# Patient Record
Sex: Male | Born: 1970 | Race: Black or African American | Hispanic: No | Marital: Single | Smoking: Current every day smoker
Health system: Southern US, Community
[De-identification: ages and names within clinical notes are randomized; demographics above are authoritative.]

## PROBLEM LIST (undated history)

## (undated) DIAGNOSIS — R040 Epistaxis: Secondary | ICD-10-CM

## (undated) DIAGNOSIS — J45909 Unspecified asthma, uncomplicated: Secondary | ICD-10-CM

## (undated) HISTORY — PX: HERNIA REPAIR: SHX51

---

## 2004-08-16 ENCOUNTER — Emergency Department: Payer: Self-pay | Admitting: Emergency Medicine

## 2004-08-28 ENCOUNTER — Emergency Department: Payer: Self-pay | Admitting: Emergency Medicine

## 2005-03-01 ENCOUNTER — Emergency Department: Payer: Self-pay | Admitting: Emergency Medicine

## 2007-02-13 ENCOUNTER — Emergency Department: Payer: Self-pay | Admitting: Unknown Physician Specialty

## 2007-11-09 ENCOUNTER — Emergency Department: Payer: Self-pay | Admitting: Emergency Medicine

## 2009-12-29 ENCOUNTER — Emergency Department: Payer: Self-pay | Admitting: Emergency Medicine

## 2010-06-12 ENCOUNTER — Emergency Department: Payer: Self-pay | Admitting: Emergency Medicine

## 2010-08-10 ENCOUNTER — Emergency Department: Payer: Self-pay | Admitting: Emergency Medicine

## 2012-07-08 ENCOUNTER — Emergency Department: Payer: Self-pay | Admitting: Emergency Medicine

## 2012-07-08 LAB — CBC
HCT: 44.8 % (ref 40.0–52.0)
HGB: 15.5 g/dL (ref 13.0–18.0)
MCH: 31.4 pg (ref 26.0–34.0)
RBC: 4.92 10*6/uL (ref 4.40–5.90)

## 2012-07-08 LAB — BASIC METABOLIC PANEL
Calcium, Total: 9.2 mg/dL (ref 8.5–10.1)
Chloride: 100 mmol/L (ref 98–107)
Co2: 26 mmol/L (ref 21–32)
Creatinine: 1.04 mg/dL (ref 0.60–1.30)
EGFR (African American): >60
Glucose: 96 mg/dL (ref 65–99)
Sodium: 137 mmol/L (ref 136–145)

## 2012-07-29 ENCOUNTER — Emergency Department: Payer: Self-pay | Admitting: Emergency Medicine

## 2012-07-29 LAB — CBC
HCT: 48.1 % (ref 40.0–52.0)
HGB: 16.8 g/dL (ref 13.0–18.0)
MCV: 91 fL (ref 80–100)
Platelet: 227 10*3/uL (ref 150–440)
RBC: 5.28 10*6/uL (ref 4.40–5.90)
WBC: 7.4 10*3/uL (ref 3.8–10.6)

## 2012-07-29 LAB — COMPREHENSIVE METABOLIC PANEL
Albumin: 4.4 g/dL (ref 3.4–5.0)
Anion Gap: 6 — ABNORMAL LOW (ref 7–16)
BUN: 11 mg/dL (ref 7–18)
Calcium, Total: 9.4 mg/dL (ref 8.5–10.1)
Co2: 29 mmol/L (ref 21–32)
Osmolality: 277 (ref 275–301)
Potassium: 4 mmol/L (ref 3.5–5.1)
Sodium: 139 mmol/L (ref 136–145)
Total Protein: 8.9 g/dL — ABNORMAL HIGH (ref 6.4–8.2)

## 2012-07-29 LAB — DRUG SCREEN, URINE
Benzodiazepine, Ur Scrn: NEGATIVE (ref ?–200)
Cannabinoid 50 Ng, Ur ~~LOC~~: NEGATIVE (ref ?–50)
Cocaine Metabolite,Ur ~~LOC~~: NEGATIVE (ref ?–300)
MDMA (Ecstasy)Ur Screen: NEGATIVE (ref ?–500)
Opiate, Ur Screen: NEGATIVE (ref ?–300)
Phencyclidine (PCP) Ur S: NEGATIVE (ref ?–25)

## 2012-07-29 LAB — URINALYSIS, COMPLETE
Bilirubin,UR: NEGATIVE
Ketone: NEGATIVE
Ph: 5 (ref 4.5–8.0)
Protein: 30
Squamous Epithelial: <1

## 2012-07-29 LAB — ETHANOL: Ethanol %: <0.003 % (ref 0.000–0.080)

## 2013-10-19 DIAGNOSIS — K439 Ventral hernia without obstruction or gangrene: Secondary | ICD-10-CM | POA: Insufficient documentation

## 2014-03-28 ENCOUNTER — Emergency Department: Payer: Self-pay | Admitting: Emergency Medicine

## 2014-06-24 ENCOUNTER — Emergency Department: Payer: Self-pay | Admitting: Emergency Medicine

## 2014-08-08 ENCOUNTER — Emergency Department: Payer: Self-pay | Admitting: Emergency Medicine

## 2014-08-08 LAB — BASIC METABOLIC PANEL
ANION GAP: 5 — AB (ref 7–16)
BUN: 15 mg/dL (ref 7–18)
CO2: 27 mmol/L (ref 21–32)
Calcium, Total: 8.9 mg/dL (ref 8.5–10.1)
Chloride: 107 mmol/L (ref 98–107)
Creatinine: 1.24 mg/dL (ref 0.60–1.30)
EGFR (Non-African Amer.): >60
Glucose: 96 mg/dL (ref 65–99)
Osmolality: 278 (ref 275–301)
POTASSIUM: 4.4 mmol/L (ref 3.5–5.1)
Sodium: 139 mmol/L (ref 136–145)

## 2014-08-08 LAB — CBC
HCT: 48.2 % (ref 40.0–52.0)
HGB: 16.4 g/dL (ref 13.0–18.0)
MCH: 31.7 pg (ref 26.0–34.0)
MCHC: 34 g/dL (ref 32.0–36.0)
MCV: 93 fL (ref 80–100)
PLATELETS: 241 10*3/uL (ref 150–440)
RBC: 5.17 10*6/uL (ref 4.40–5.90)
RDW: 13.2 % (ref 11.5–14.5)
WBC: 7.2 10*3/uL (ref 3.8–10.6)
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
## 2015-04-27 ENCOUNTER — Encounter: Payer: Self-pay | Admitting: Emergency Medicine

## 2015-04-27 ENCOUNTER — Emergency Department
Admission: EM | Admit: 2015-04-27 | Discharge: 2015-04-27 | Disposition: A | Discharge status: Home / Self Care | Payer: Self-pay | Attending: Emergency Medicine | Admitting: Emergency Medicine

## 2015-04-27 ENCOUNTER — Emergency Department: Payer: Self-pay

## 2015-04-27 DIAGNOSIS — Y9289 Other specified places as the place of occurrence of the external cause: Secondary | ICD-10-CM | POA: Insufficient documentation

## 2015-04-27 DIAGNOSIS — X58XXXA Exposure to other specified factors, initial encounter: Secondary | ICD-10-CM | POA: Insufficient documentation

## 2015-04-27 DIAGNOSIS — Y9389 Activity, other specified: Secondary | ICD-10-CM | POA: Insufficient documentation

## 2015-04-27 DIAGNOSIS — Y998 Other external cause status: Secondary | ICD-10-CM | POA: Insufficient documentation

## 2015-04-27 DIAGNOSIS — S39011A Strain of muscle, fascia and tendon of abdomen, initial encounter: Secondary | ICD-10-CM | POA: Insufficient documentation

## 2015-04-27 DIAGNOSIS — R0981 Nasal congestion: Secondary | ICD-10-CM | POA: Insufficient documentation

## 2015-04-27 LAB — CBC WITH DIFFERENTIAL/PLATELET
BASOS ABS: 0.1 10*3/uL (ref 0–0.1)
BASOS PCT: 1 %
EOS ABS: 0.2 10*3/uL (ref 0–0.7)
Eosinophils Relative: 2 %
HEMATOCRIT: 49.7 % (ref 40.0–52.0)
Hemoglobin: 17 g/dL (ref 13.0–18.0)
Lymphocytes Relative: 35 %
Lymphs Abs: 2.7 10*3/uL (ref 1.0–3.6)
MCH: 31.8 pg (ref 26.0–34.0)
MCHC: 34.2 g/dL (ref 32.0–36.0)
MCV: 92.8 fL (ref 80.0–100.0)
Monocytes Absolute: 0.9 10*3/uL (ref 0.2–1.0)
Monocytes Relative: 12 %
Neutro Abs: 3.9 10*3/uL (ref 1.4–6.5)
Neutrophils Relative %: 50 %
PLATELETS: 230 10*3/uL (ref 150–440)
RBC: 5.35 MIL/uL (ref 4.40–5.90)
RDW: 14.1 % (ref 11.5–14.5)
WBC: 7.6 10*3/uL (ref 3.8–10.6)

## 2015-04-27 LAB — URINALYSIS COMPLETE WITH MICROSCOPIC (ARMC ONLY)
Bacteria, UA: NONE SEEN
Bilirubin Urine: NEGATIVE
Glucose, UA: NEGATIVE mg/dL
KETONES UR: NEGATIVE mg/dL
Leukocytes, UA: NEGATIVE
Nitrite: NEGATIVE
PROTEIN: 30 mg/dL — AB
SPECIFIC GRAVITY, URINE: 1.021 (ref 1.005–1.030)
Squamous Epithelial / LPF: NONE SEEN
pH: 5 (ref 5.0–8.0)

## 2015-04-27 LAB — COMPREHENSIVE METABOLIC PANEL
ALT: 35 U/L (ref 17–63)
ANION GAP: 7 (ref 5–15)
AST: 32 U/L (ref 15–41)
Albumin: 4.7 g/dL (ref 3.5–5.0)
Alkaline Phosphatase: 95 U/L (ref 38–126)
BUN: 16 mg/dL (ref 6–20)
CHLORIDE: 102 mmol/L (ref 101–111)
CO2: 28 mmol/L (ref 22–32)
CREATININE: 1.24 mg/dL (ref 0.61–1.24)
Calcium: 9.6 mg/dL (ref 8.9–10.3)
GFR calc Af Amer: >60 mL/min (ref >60)
Glucose, Bld: 106 mg/dL — ABNORMAL HIGH (ref 65–99)
Potassium: 3.9 mmol/L (ref 3.5–5.1)
Sodium: 137 mmol/L (ref 135–145)
TOTAL PROTEIN: 8.4 g/dL — AB (ref 6.5–8.1)
Total Bilirubin: 0.4 mg/dL (ref 0.3–1.2)

## 2015-04-27 LAB — LIPASE, BLOOD: Lipase: 28 U/L (ref 22–51)

## 2015-04-27 MED ORDER — CYCLOBENZAPRINE HCL 10 MG PO TABS
10.0000 mg | ORAL_TABLET | Freq: Once | ORAL | Status: AC
  Administered 2015-04-27: 10 mg via ORAL

## 2015-04-27 MED ORDER — ONDANSETRON 4 MG PO TBDP
4.0000 mg | ORAL_TABLET | Freq: Once | ORAL | Status: AC
  Administered 2015-04-27: 4 mg via ORAL

## 2015-04-27 MED ORDER — HYDROMORPHONE HCL 1 MG/ML IJ SOLN
INTRAMUSCULAR | Status: AC
  Administered 2015-04-27: 1 mg via INTRAMUSCULAR
  Filled 2015-04-27: qty 1

## 2015-04-27 MED ORDER — IBUPROFEN 600 MG PO TABS
ORAL_TABLET | ORAL | Status: AC
  Administered 2015-04-27: 600 mg via ORAL
  Filled 2015-04-27: qty 1

## 2015-04-27 MED ORDER — HYDROMORPHONE HCL 1 MG/ML IJ SOLN
1.0000 mg | Freq: Once | INTRAMUSCULAR | Status: AC
  Administered 2015-04-27: 1 mg via INTRAMUSCULAR

## 2015-04-27 MED ORDER — CYCLOBENZAPRINE HCL 10 MG PO TABS: 10.0000 mg | ORAL_TABLET | Freq: Three times a day (TID) | ORAL | Status: DC | PRN

## 2015-04-27 MED ORDER — CYCLOBENZAPRINE HCL 10 MG PO TABS
ORAL_TABLET | ORAL | Status: AC
  Administered 2015-04-27: 10 mg via ORAL
  Filled 2015-04-27: qty 1

## 2015-04-27 MED ORDER — ONDANSETRON 4 MG PO TBDP
ORAL_TABLET | ORAL | Status: AC
  Administered 2015-04-27: 4 mg via ORAL
  Filled 2015-04-27: qty 1

## 2015-04-27 MED ORDER — HYDROCODONE-ACETAMINOPHEN 5-325 MG PO TABS: 1.0000 | ORAL_TABLET | Freq: Four times a day (QID) | ORAL | Status: DC | PRN

## 2015-04-27 MED ORDER — IBUPROFEN 600 MG PO TABS: 600.0000 mg | ORAL_TABLET | Freq: Three times a day (TID) | ORAL | Status: DC | PRN

## 2015-04-27 MED ORDER — IBUPROFEN 600 MG PO TABS
600.0000 mg | ORAL_TABLET | Freq: Once | ORAL | Status: AC
  Administered 2015-04-27: 600 mg via ORAL

## 2015-04-27 NOTE — Discharge Instructions (Signed)
You examine evaluation are reassuring today. I suspect her pain is from a pulled muscle due to coughing and sneezing. We discussed no indication today for CT scan, however return to the emergency department immediately for any new or worsening abdominal pain, black or bloody stools, vomiting, fever, chest pain, trouble breathing, or any other symptoms concerning to you.   Muscle Strain A muscle strain (pulled muscle) happens when a muscle is stretched beyond normal length. It happens when a sudden, violent force stretches your muscle too far. Usually, a few of the fibers in your muscle are torn. Muscle strain is common in athletes. Recovery usually takes 1-2 weeks. Complete healing takes 5-6 weeks.  HOME CARE   Follow the PRICE method of treatment to help your injury get better. Do this the first 2-3 days after the injury:  Protect. Protect the muscle to keep it from getting injured again.  Rest. Limit your activity and rest the injured body part.  Ice. Put ice in a plastic bag. Place a towel between your skin and the bag. Then, apply the ice and leave it on from 15-20 minutes each hour. After the third day, switch to moist heat packs.  Compression. Use a splint or elastic bandage on the injured area for comfort. Do not put it on too tightly.  Elevate. Keep the injured body part above the level of your heart.  Only take medicine as told by your doctor.  Warm up before doing exercise to prevent future muscle strains. GET HELP IF:   You have more pain or puffiness (swelling) in the injured area.  You feel numbness, tingling, or notice a loss of strength in the injured area. MAKE SURE YOU:   Understand these instructions.  Will watch your condition.  Will get help right away if you are not doing well or get worse. Document Released: 08/06/2008 Document Revised: 08/18/2013 Document Reviewed: 05/27/2013 [facility] Patient Information 2015 [place], [place]. This information is not intended  to replace advice given to you by your health care provider. Make sure you discuss any questions you have with your health care provider.

## 2015-04-27 NOTE — ED Provider Notes (Signed)
[facility] Emergency Department Provider Note   ____________________________________________  Time seen: 6:45 PM I have reviewed the triage vital signs and the triage nursing note.  HISTORY  Chief Complaint Abdominal Pain   Historian Patient  HPI Mark [name] is a 44 y.o. male who is complaining of right side abdominal pain which is sharp, somewhat constant but waxing and waning. It is much worse when he sneezes. He's been sneezing and coughing recently due to a "head cold." He is not really complaining of shortness of breath, or chest pain. He's had no palpitations. He has no fever. He takes no medications daily. He has no primary care physician. Pain currently is 8 out of 10.   History reviewed. No pertinent past medical history. wheezing  There are no active problems to display for this patient.   History reviewed. No pertinent past surgical history.  Current Outpatient Rx  Name  Route  Sig  Dispense  Refill  . cyclobenzaprine (FLEXERIL) 10 MG tablet   Oral   Take 1 tablet (10 mg total) by mouth every 8 (eight) hours as needed for muscle spasms.   20 tablet   0   . HYDROcodone-acetaminophen (NORCO/VICODIN) 5-325 MG per tablet   Oral   Take 1 tablet by mouth every 6 (six) hours as needed for moderate pain.   10 tablet   0   . ibuprofen (ADVIL,MOTRIN) 600 MG tablet   Oral   Take 1 tablet (600 mg total) by mouth every 8 (eight) hours as needed.   20 tablet   0    "inhaler"  Allergies Peanuts  No family history on file.  Social History History  Substance Use Topics  . Smoking status: Not on file  . Smokeless tobacco: Not on file  . Alcohol Use: Not on file   positive for smoking  Review of Systems  Constitutional: Negative for fever. Eyes: Negative for visual changes. ENT: Negative for sore throat. Positive for sinus congestion. Cardiovascular: Negative for chest pain. Respiratory: Negative for shortness of breath. Positive  for cough and wheezing. Gastrointestinal: Negative for vomiting and diarrhea. Genitourinary: Negative for urinary symptoms Musculoskeletal: Negative for back pain. Skin: Negative for rash. Neurological: Negative for focal weakness or numbness.  ____________________________________________   PHYSICAL EXAM:  VITAL SIGNS: ED Triage Vitals  Enc Vitals Group     BP 04/27/15 1453 140/85 mmHg     Pulse Rate 04/27/15 1453 85     Resp 04/27/15 1453 20     Temp 04/27/15 1453 97.4 F (36.3 C)     Temp Source 04/27/15 1453 Oral     SpO2 04/27/15 1453 98 %     Weight 04/27/15 1453 185 lb (83.915 kg)     Height 04/27/15 1453  (1.753 m)     Head Cir --      Peak Flow --      Pain Score 04/27/15 1454 10     Pain Loc --      Pain Edu? --      Excl. in GC? --      Constitutional: Alert and oriented. Well appearing and in no distress. Eyes: Conjunctivae are normal. Left lazy eye deviation to the lateral side. ENT   Head: Normocephalic and atraumatic.   Nose: Positive for nasal congestion.   Mouth/Throat: Mucous membranes are moist.   Neck: No stridor. Cardiovascular: Normal rate, regular rhythm.  No murmurs, rubs, or gallops. Respiratory: Normal respiratory effort without tachypnea nor retractions. Mild  wheezing, right greater than left. No rhonchi. Gastrointestinal: Soft. No distention, no guarding, no rebound. Moderate point tenderness and right-sided abdomen below the area of the gallbladder.  Genitourinary/rectal: Deferred Musculoskeletal: Nontender with normal range of motion in all extremities. No joint effusions.  No lower extremity tenderness nor edema. Neurologic:  Normal speech and language. No gross focal neurologic deficits are appreciated. Skin:  Skin is warm, dry and intact. No rash noted. Psychiatric: Mood and affect are normal. Speech and behavior are normal. Patient exhibits appropriate insight and  judgment.  ____________________________________________   EKG I, Governor Rooks, MD, the attending physician have personally viewed and interpreted all ECGs.  None ____________________________________________  LABS (pertinent positives/negatives)  Lipase normal Metabolic panel normal Urinalysis negative for ketones, RBC, WBC, bacteria CBC normal  ____________________________________________  RADIOLOGY Imaging viewed by me, and interpreted by Radiologist   Chest x-ray two-view: No acute disease per my view, mild diffuse  pain suggesting acute bronchitis per radiologist   __________________________________________  PROCEDURES  Procedure(s) performed: None Critical Care performed: None  ____________________________________________   ED COURSE / ASSESSMENT AND PLAN  Pertinent labs & imaging results that were available during my care of the patient were reviewed by me and considered in my medical decision making (see chart for details).  Clinically I suspect a pulled abdominal muscle due to recent sneezing. His exam and evaluation are reassuring. Do not suspect biliary colic, ureteral colic, or other emergency thoracic, or abdominal source of his pain. Return precautions and discharge instructions and follow-up discussed with patient.  Patient has an albuterol inhaler per the patient, he's having no significant trouble breathing to baseline and his symptoms seem to be more head cold and sneezing rather than COPD or bronchitis.  ___________________________________________   FINAL CLINICAL IMPRESSION(S) / ED DIAGNOSES   Final diagnoses:  Abdominal muscle strain, initial encounter  Sinus congestion      Governor Rooks, MD 04/27/15 2041

## 2015-04-27 NOTE — ED Notes (Signed)
Patient with no complaints at this time. Respirations even and unlabored. Skin warm/dry. Discharge instructions reviewed with patient at this time. Patient given opportunity to voice concerns/ask questions. Patient discharged at this time and left Emergency Department with steady gait.   

## 2015-04-27 NOTE — ED Notes (Signed)
Patient presents to the ED with sharp right sided abdominal pain when he sneezes.  Patient states he only has the pain when he sneezes.  Patient reports cold symptoms for several days including, cough, congestion and sneezing.  Patient is in no obvious distress at this time.

## 2015-12-07 ENCOUNTER — Emergency Department: Payer: Self-pay

## 2015-12-07 ENCOUNTER — Emergency Department
Admission: EM | Admit: 2015-12-07 | Discharge: 2015-12-07 | Disposition: A | Discharge status: Home / Self Care | Payer: Self-pay | Attending: Emergency Medicine | Admitting: Emergency Medicine

## 2015-12-07 ENCOUNTER — Encounter: Payer: Self-pay | Admitting: Emergency Medicine

## 2015-12-07 DIAGNOSIS — F17210 Nicotine dependence, cigarettes, uncomplicated: Secondary | ICD-10-CM | POA: Insufficient documentation

## 2015-12-07 DIAGNOSIS — R5383 Other fatigue: Secondary | ICD-10-CM | POA: Insufficient documentation

## 2015-12-07 DIAGNOSIS — J45901 Unspecified asthma with (acute) exacerbation: Secondary | ICD-10-CM | POA: Insufficient documentation

## 2015-12-07 DIAGNOSIS — J209 Acute bronchitis, unspecified: Secondary | ICD-10-CM

## 2015-12-07 HISTORY — DX: Unspecified asthma, uncomplicated: J45.909

## 2015-12-07 MED ORDER — ALBUTEROL SULFATE HFA 108 (90 BASE) MCG/ACT IN AERS: 1.0000 | INHALATION_SPRAY | Freq: Four times a day (QID) | RESPIRATORY_TRACT | Status: AC | PRN

## 2015-12-07 MED ORDER — HYDROCOD POLST-CPM POLST ER 10-8 MG/5ML PO SUER: 5.0000 mL | Freq: Two times a day (BID) | ORAL | Status: DC | PRN

## 2015-12-07 MED ORDER — AZITHROMYCIN 250 MG PO TABS: ORAL_TABLET | ORAL | Status: DC

## 2015-12-07 NOTE — Discharge Instructions (Signed)
Acute Bronchitis Bronchitis is inflammation of the airways that extend from the windpipe into the lungs (bronchi). The inflammation often causes mucus to develop. This leads to a cough, which is the most common symptom of bronchitis.  In acute bronchitis, the condition usually develops suddenly and goes away over time, usually in a couple weeks. Smoking, allergies, and asthma can make bronchitis worse. Repeated episodes of bronchitis may cause further lung problems.  CAUSES Acute bronchitis is most often caused by the same virus that causes a cold. The virus can spread from person to person (contagious) through coughing, sneezing, and touching contaminated objects. SIGNS AND SYMPTOMS   Cough.   Fever.   Coughing up mucus.   Body aches.   Chest congestion.   Chills.   Shortness of breath.   Sore throat.  DIAGNOSIS  Acute bronchitis is usually diagnosed through a physical exam. Your health care provider will also ask you questions about your medical history. Tests, such as chest X-rays, are sometimes done to rule out other conditions.  TREATMENT  Acute bronchitis usually goes away in a couple weeks. Oftentimes, no medical treatment is necessary. Medicines are sometimes given for relief of fever or cough. Antibiotic medicines are usually not needed but may be prescribed in certain situations. In some cases, an inhaler may be recommended to help reduce shortness of breath and control the cough. A cool mist vaporizer may also be used to help thin bronchial secretions and make it easier to clear the chest.  HOME CARE INSTRUCTIONS  Get plenty of rest.   Drink enough fluids to keep your urine clear or pale yellow (unless you have a medical condition that requires fluid restriction). Increasing fluids may help thin your respiratory secretions (sputum) and reduce chest congestion, and it will prevent dehydration.   Take medicines only as directed by your health care provider.  If  you were prescribed an antibiotic medicine, finish it all even if you start to feel better.  Avoid smoking and secondhand smoke. Exposure to cigarette smoke or irritating chemicals will make bronchitis worse. If you are a smoker, consider using nicotine gum or skin patches to help control withdrawal symptoms. Quitting smoking will help your lungs heal faster.   Reduce the chances of another bout of acute bronchitis by washing your hands frequently, avoiding people with cold symptoms, and trying not to touch your hands to your mouth, nose, or eyes.   Keep all follow-up visits as directed by your health care provider.  SEEK MEDICAL CARE IF: Your symptoms do not improve after 1 week of treatment.  SEEK IMMEDIATE MEDICAL CARE IF:  You develop an increased fever or chills.   You have chest pain.   You have severe shortness of breath.  You have bloody sputum.   You develop dehydration.  You faint or repeatedly feel like you are going to pass out.  You develop repeated vomiting.  You develop a severe headache. MAKE SURE YOU:   Understand these instructions.  Will watch your condition.  Will get help right away if you are not doing well or get worse.   This information is not intended to replace advice given to you by your health care provider. Make sure you discuss any questions you have with your health care provider.   Document Released: 12/05/2004 Document Revised: 11/18/2014 Document Reviewed: 04/20/2013 Elsevier Interactive Patient Education [employer]   Please follow up with your PCP in 2 days if not improving.

## 2015-12-07 NOTE — ED Provider Notes (Signed)
[facility] Emergency Department Provider Note  ____________________________________________  Time seen: Approximately 10:03 AM  I have reviewed the triage vital signs and the nursing notes.   HISTORY  Chief Complaint No chief complaint on file.    HPI Mark [name] is a 45 y.o. male, NAD, presents to the emergency department this morning with 2 weeks of chest congestion and cough. Notes everyone in his household is ill with the same symptoms but they have improved. Denies nasal congestion, sinus pressure, ear pain, sore throat, postnasal drip, ear pain. Does note headache with cough. Cough can be cyclical and keeps him up at night. States he has an inhaler but cannot find it to use. Does note some wheezing when he lays down at night but no other time. Smokes one pack of tobacco cigarettes daily and has been doing so for over 20 years. Denies chest pain, shortness of breath, fever, chills, body aches.   Past Medical History  Diagnosis Date  . Asthma     There are no active problems to display for this patient.   History reviewed. No pertinent past surgical history.  Current Outpatient Rx  Name  Route  Sig  Dispense  Refill  . albuterol (PROVENTIL HFA;VENTOLIN HFA) 108 (90 Base) MCG/ACT inhaler   Inhalation   Inhale 1-2 puffs into the lungs every 6 (six) hours as needed for wheezing or shortness of breath.   1 Inhaler   2   . azithromycin (ZITHROMAX Z-PAK) 250 MG tablet      Take 2 tablets (500 mg) on  Day 1,  followed by 1 tablet (250 mg) once daily on Days 2 through 5.   6 each   0   . chlorpheniramine-HYDROcodone (TUSSIONEX PENNKINETIC ER) 10-8 MG/5ML SUER   Oral   Take 5 mLs by mouth every 12 (twelve) hours as needed for cough.   70 mL   0   . cyclobenzaprine (FLEXERIL) 10 MG tablet   Oral   Take 1 tablet (10 mg total) by mouth every 8 (eight) hours as needed for muscle spasms.   20 tablet   0   . HYDROcodone-acetaminophen  (NORCO/VICODIN) 5-325 MG per tablet   Oral   Take 1 tablet by mouth every 6 (six) hours as needed for moderate pain.   10 tablet   0   . ibuprofen (ADVIL,MOTRIN) 600 MG tablet   Oral   Take 1 tablet (600 mg total) by mouth every 8 (eight) hours as needed.   20 tablet   0     Allergies Peanuts  No family history on file.  Social History Social History  Substance Use Topics  . Smoking status: Current Every Day Smoker  . Smokeless tobacco: None  . Alcohol Use: No     Review of Systems  Constitutional: No fever/chills. Positive for fatigue. Eyes: No visual changes. No discharge ENT: No sore throat, ear pain, nasal congestion, sinus pressure, his nasal drip Cardiovascular: no chest pain. Respiratory: Positive for cough, congestion, wheezing. Negative for shortness of breath Musculoskeletal: Negative for myalgias Skin: Negative for rash. Neurological: Positive for headache with cough. No focal weakness, numbness, tingling 10-point ROS otherwise negative.  ____________________________________________   PHYSICAL EXAM:  VITAL SIGNS: ED Triage Vitals  Enc Vitals Group     BP 12/07/15 0928 121/77 mmHg     Pulse Rate 12/07/15 0928 85     Resp 12/07/15 0928 20     Temp 12/07/15 0928 98.2 F (36.8 C)  Temp Source 12/07/15 0928 Oral     SpO2 12/07/15 0928 95 %     Weight 12/07/15 0928 180 lb (81.647 kg)     Height 12/07/15 0928  (1.753 m)     Head Cir --      Peak Flow --      Pain Score --      Pain Loc --      Pain Edu? --      Excl. in GC? --     Constitutional: Alert and oriented. Well appearing and in no acute distress. Eyes: Conjunctivae are normal. Head: Atraumatic. ENT:      Ears: Bilateral TMs visualized with injection and mild bulging. No effusion. Lateral ear canals within normal limits.      Nose: No congestion/rhinnorhea.      Mouth/Throat: Mucous membranes are moist. No oral lesions. Neck: No stridor. Neck supple with full range of  motion Hematological/Lymphatic/Immunilogical: No cervical lymphadenopathy. Cardiovascular: Normal rate, regular rhythm. Normal S1 and S2.  Good peripheral circulation. Respiratory: Normal respiratory effort without tachypnea or retractions. Mild wheeze noted in right upper lung field otherwise all other lung fields CTA be Neurologic:  Normal speech and language. No gross focal neurologic deficits are appreciated.  Skin:  Skin is warm, dry and intact. No rash noted. Psychiatric: Mood and affect are normal. Speech and behavior are normal. Patient exhibits appropriate insight and judgement.   ____________________________________________   LABS (all labs ordered are listed, but only abnormal results are displayed)  Labs Reviewed - No data to display ____________________________________________  EKG  None ____________________________________________  RADIOLOGY I, Hagler,Jami L, personally viewed and evaluated these images (plain radiographs) as part of my medical decision making, as well as reviewing the written report by the radiologist.  Dg Chest 2 View  12/07/2015  CLINICAL DATA:  Cough for 2 weeks.  Nasal congestion. EXAM: CHEST  2 VIEW COMPARISON:  04/27/2015 FINDINGS: The heart size and mediastinal contours are within normal limits. Both lungs are clear. The visualized skeletal structures are unremarkable. IMPRESSION: No active cardiopulmonary disease. Electronically Signed   By: Elige Ko   On: 12/07/2015 10:30    ____________________________________________    PROCEDURES  Procedure(s) performed: None    Medications - No data to display   ____________________________________________   INITIAL IMPRESSION / ASSESSMENT AND PLAN / ED COURSE  Pertinent labs & imaging results that were available during my care of the patient were reviewed by me and considered in my medical decision making (see chart for details).  Patient's diagnosis is consistent with acute  bronchitis. Patient will be discharged home with prescriptions for azithromycin to treat bacterial infection, depression next cough syrup to treat cough and increase rest, albuterol inhaler to use as needed for shortness of breath. Patient is to follow up with his primary care provider if symptoms persist past this treatment course. Patient is given ED precautions to return to the ED for any worsening or new symptoms.     ____________________________________________  FINAL CLINICAL IMPRESSION(S) / ED DIAGNOSES  Final diagnoses:  Acute bronchitis, unspecified organism      NEW MEDICATIONS STARTED DURING THIS VISIT:  Discharge Medication List as of 12/07/2015 10:57 AM    START taking these medications   Details  albuterol (PROVENTIL HFA;VENTOLIN HFA) 108 (90 Base) MCG/ACT inhaler Inhale 1-2 puffs into the lungs every 6 (six) hours as needed for wheezing or shortness of breath., Starting 12/07/2015, Until Discontinued, Print    azithromycin (ZITHROMAX Z-PAK) 250 MG tablet  Take 2 tablets (500 mg) on  Day 1,  followed by 1 tablet (250 mg) once daily on Days 2 through 5., Print    chlorpheniramine-HYDROcodone (TUSSIONEX PENNKINETIC ER) 10-8 MG/5ML SUER Take 5 mLs by mouth every 12 (twelve) hours as needed for cough., Starting 12/07/2015, Until Discontinued, Print            Hope Pigeon, PA-C 12/07/15 1121  Emily Filbert, MD 12/07/15 [phone]

## 2015-12-07 NOTE — ED Notes (Signed)
Cold sx's for the past 2 weeks   Chest and nasal congestion

## 2015-12-13 ENCOUNTER — Encounter: Payer: Self-pay | Admitting: Urgent Care

## 2015-12-13 ENCOUNTER — Emergency Department
Admission: EM | Admit: 2015-12-13 | Discharge: 2015-12-13 | Disposition: A | Discharge status: Home / Self Care | Payer: Self-pay | Attending: Emergency Medicine | Admitting: Emergency Medicine

## 2015-12-13 DIAGNOSIS — R040 Epistaxis: Secondary | ICD-10-CM | POA: Insufficient documentation

## 2015-12-13 DIAGNOSIS — F17200 Nicotine dependence, unspecified, uncomplicated: Secondary | ICD-10-CM | POA: Insufficient documentation

## 2015-12-13 MED ORDER — OXYMETAZOLINE HCL 0.05 % NA SOLN: 2.0000 | Freq: Two times a day (BID) | NASAL | Status: DC | PRN

## 2015-12-13 NOTE — Discharge Instructions (Signed)

## 2015-12-13 NOTE — ED Provider Notes (Signed)
[facility] Emergency Department Provider Note  ____________________________________________  Time seen: Approximately 8:15 PM  I have reviewed the triage vital signs and the nursing notes.   HISTORY  Chief Complaint Epistaxis    HPI Mark [name] is a 45 y.o. male who presents to emergency department for 9 day history of intermittent nosebleed. Patient states that he has had a long history of nosebleed stretching back to "well secured." He states that he gets them every "so often." He states that this is different and so much as he has had a nosebleed daily 9 days. Patient states that he came in to stop with direct pressure. He denies any headaches associated with this. He denies any visual acuity changes, neck pain, chest pain. He denies being on blood thinners or blood pressure medication. Patient denies any trauma to area.   Past Medical History  Diagnosis Date  . Asthma     There are no active problems to display for this patient.   History reviewed. No pertinent past surgical history.  Current Outpatient Rx  Name  Route  Sig  Dispense  Refill  . albuterol (PROVENTIL HFA;VENTOLIN HFA) 108 (90 Base) MCG/ACT inhaler   Inhalation   Inhale 1-2 puffs into the lungs every 6 (six) hours as needed for wheezing or shortness of breath.   1 Inhaler   2   . azithromycin (ZITHROMAX Z-PAK) 250 MG tablet      Take 2 tablets (500 mg) on  Day 1,  followed by 1 tablet (250 mg) once daily on Days 2 through 5.   6 each   0   . chlorpheniramine-HYDROcodone (TUSSIONEX PENNKINETIC ER) 10-8 MG/5ML SUER   Oral   Take 5 mLs by mouth every 12 (twelve) hours as needed for cough.   70 mL   0   . cyclobenzaprine (FLEXERIL) 10 MG tablet   Oral   Take 1 tablet (10 mg total) by mouth every 8 (eight) hours as needed for muscle spasms.   20 tablet   0   . HYDROcodone-acetaminophen (NORCO/VICODIN) 5-325 MG per tablet   Oral   Take 1 tablet by mouth every 6 (six)  hours as needed for moderate pain.   10 tablet   0   . ibuprofen (ADVIL,MOTRIN) 600 MG tablet   Oral   Take 1 tablet (600 mg total) by mouth every 8 (eight) hours as needed.   20 tablet   0   . oxymetazoline (AFRIN) 0.05 % nasal spray   Each Nare   Place 2 sprays into both nostrils 2 (two) times daily as needed (epistaxis).   15 mL   2     Allergies Peanuts  No family history on file.  Social History Social History  Substance Use Topics  . Smoking status: Current Every Day Smoker  . Smokeless tobacco: None  . Alcohol Use: No     Review of Systems  Constitutional: No fever/chills Eyes: No visual changes. No discharge ENT: No sore throat. Positive for nosebleeds Cardiovascular: no chest pain. Respiratory: no cough. No SOB. Gastrointestinal: No abdominal pain.  No nausea, no vomiting.  Skin: Negative for rash. Neurological: Negative for headaches, focal weakness or numbness. 10-point ROS otherwise negative.  ____________________________________________   PHYSICAL EXAM:  VITAL SIGNS: ED Triage Vitals  Enc Vitals Group     BP 12/13/15 1936 140/88 mmHg     Pulse Rate 12/13/15 1936 82     Resp 12/13/15 1936 18  Temp 12/13/15 1936 98.6 F (37 C)     Temp Source 12/13/15 1936 Oral     SpO2 12/13/15 1936 96 %     Weight 12/13/15 1936 180 lb (81.647 kg)     Height 12/13/15 1936  (1.727 m)     Head Cir --      Peak Flow --      Pain Score 12/13/15 1937 0     Pain Loc --      Pain Edu? --      Excl. in GC? --      Constitutional: Alert and oriented. Well appearing and in no acute distress. Eyes: Conjunctivae are normal. PERRL. EOMI. Head: Atraumatic. ENT:      Ears:       Nose: No congestion/rhinnorhea. Scabbing is noted over the distal Kiesselbach plexus on the left nares. No other abnormality. Turbinates are nonerythematous and nonedematous. No bleeding at this time.      Mouth/Throat: Mucous membranes are moist.  Neck: No stridor.    Hematological/Lymphatic/Immunilogical: No cervical lymphadenopathy. Cardiovascular: Normal rate, regular rhythm. Normal S1 and S2.  Good peripheral circulation. Respiratory: Normal respiratory effort without tachypnea or retractions. Lungs CTAB. Neurologic:  Normal speech and language. No gross focal neurologic deficits are appreciated.  Skin:  Skin is warm, dry and intact. No rash noted. Psychiatric: Mood and affect are normal. Speech and behavior are normal. Patient exhibits appropriate insight and judgement.   ____________________________________________   LABS (all labs ordered are listed, but only abnormal results are displayed)  Labs Reviewed - No data to display ____________________________________________  EKG   ____________________________________________  RADIOLOGY   No results found.  ____________________________________________    PROCEDURES  Procedure(s) performed:       Medications - No data to display   ____________________________________________   INITIAL IMPRESSION / ASSESSMENT AND PLAN / ED COURSE  Pertinent labs & imaging results that were available during my care of the patient were reviewed by me and considered in my medical decision making (see chart for details).  Patient's diagnosis is consistent with epistaxis. There is no current bleeding at this time. Patient is informed that he needs to follow-up with an ears nose and throat provider for further evaluation and treatment.. Patient will be discharged home with prescriptions for Afrin to be used as needed for recurring nosebleeds.  Patient is given ED precautions to return to the ED for any worsening or new symptoms.     ____________________________________________  FINAL CLINICAL IMPRESSION(S) / ED DIAGNOSES  Final diagnoses:  Epistaxis, recurrent      NEW MEDICATIONS STARTED DURING THIS VISIT:  New Prescriptions   OXYMETAZOLINE (AFRIN) 0.05 % NASAL SPRAY    Place 2  sprays into both nostrils 2 (two) times daily as needed (epistaxis).        Delorise Royals Cuthriell, PA-C 12/13/15 1610  Phineas Semen, MD 12/13/15 2153

## 2015-12-13 NOTE — ED Notes (Signed)
Patient presents with c/o a daily nose bleed s/p having a cold recently. Reports daily bleeding from LEFT nare x 9 days. No active bleeding at this time. Of note, patient reports that family member here for treatment (arrived via EMS) - patient states, "I thought I would just get checked out while I was up here anyway".

## 2015-12-13 NOTE — ED Notes (Signed)
Pt states bleeding 1-2 times a day for 9 days from left nostril.  States "covers half of a washcloth".  Denies injury to nose, inserting anything into nose, denies taking blood thinners.

## 2016-06-10 ENCOUNTER — Emergency Department
Admission: EM | Admit: 2016-06-10 | Discharge: 2016-06-10 | Disposition: A | Discharge status: Home / Self Care | Payer: Self-pay | Attending: Emergency Medicine | Admitting: Emergency Medicine

## 2016-06-10 ENCOUNTER — Emergency Department: Payer: Self-pay

## 2016-06-10 ENCOUNTER — Encounter: Payer: Self-pay | Admitting: Emergency Medicine

## 2016-06-10 DIAGNOSIS — Z7951 Long term (current) use of inhaled steroids: Secondary | ICD-10-CM | POA: Insufficient documentation

## 2016-06-10 DIAGNOSIS — J45909 Unspecified asthma, uncomplicated: Secondary | ICD-10-CM | POA: Insufficient documentation

## 2016-06-10 DIAGNOSIS — R519 Headache, unspecified: Secondary | ICD-10-CM

## 2016-06-10 DIAGNOSIS — R079 Chest pain, unspecified: Secondary | ICD-10-CM | POA: Insufficient documentation

## 2016-06-10 DIAGNOSIS — F17200 Nicotine dependence, unspecified, uncomplicated: Secondary | ICD-10-CM | POA: Insufficient documentation

## 2016-06-10 DIAGNOSIS — R51 Headache: Secondary | ICD-10-CM | POA: Insufficient documentation

## 2016-06-10 LAB — COMPREHENSIVE METABOLIC PANEL
ALBUMIN: 4.6 g/dL (ref 3.5–5.0)
ALT: 28 U/L (ref 17–63)
ANION GAP: 8 (ref 5–15)
AST: 27 U/L (ref 15–41)
Alkaline Phosphatase: 76 U/L (ref 38–126)
BUN: 11 mg/dL (ref 6–20)
CO2: 25 mmol/L (ref 22–32)
Calcium: 9.4 mg/dL (ref 8.9–10.3)
Chloride: 101 mmol/L (ref 101–111)
Creatinine, Ser: 1.01 mg/dL (ref 0.61–1.24)
GFR calc Af Amer: >60 mL/min (ref >60)
GFR calc non Af Amer: >60 mL/min (ref >60)
GLUCOSE: 109 mg/dL — AB (ref 65–99)
POTASSIUM: 4.6 mmol/L (ref 3.5–5.1)
Sodium: 134 mmol/L — ABNORMAL LOW (ref 135–145)
Total Bilirubin: 0.8 mg/dL (ref 0.3–1.2)
Total Protein: 8 g/dL (ref 6.5–8.1)

## 2016-06-10 LAB — CBC WITH DIFFERENTIAL/PLATELET
BASOS ABS: 0.1 10*3/uL (ref 0–0.1)
Eosinophils Absolute: 0 10*3/uL (ref 0–0.7)
Eosinophils Relative: 0 %
HCT: 44.9 % (ref 40.0–52.0)
HEMOGLOBIN: 16.2 g/dL (ref 13.0–18.0)
Lymphocytes Relative: 24 %
Lymphs Abs: 2.4 10*3/uL (ref 1.0–3.6)
MCH: 32 pg (ref 26.0–34.0)
MCHC: 36.1 g/dL — ABNORMAL HIGH (ref 32.0–36.0)
MCV: 88.7 fL (ref 80.0–100.0)
Monocytes Absolute: 0.6 10*3/uL (ref 0.2–1.0)
Monocytes Relative: 6 %
NEUTROS ABS: 6.8 10*3/uL — AB (ref 1.4–6.5)
Platelets: 217 10*3/uL (ref 150–440)
RBC: 5.06 MIL/uL (ref 4.40–5.90)
RDW: 14 % (ref 11.5–14.5)
WBC: 9.9 10*3/uL (ref 3.8–10.6)

## 2016-06-10 LAB — TROPONIN I: Troponin I: <0.03 ng/mL (ref <0.03)

## 2016-06-10 MED ORDER — SODIUM CHLORIDE 0.9 % IV BOLUS (SEPSIS)
1000.0000 mL | Freq: Once | INTRAVENOUS | Status: AC
  Administered 2016-06-10: 1000 mL via INTRAVENOUS

## 2016-06-10 MED ORDER — PROCHLORPERAZINE MALEATE 10 MG PO TABS: 10.0000 mg | ORAL_TABLET | Freq: Three times a day (TID) | ORAL | 0 refills | Status: DC | PRN

## 2016-06-10 MED ORDER — PROCHLORPERAZINE EDISYLATE 5 MG/ML IJ SOLN
10.0000 mg | Freq: Once | INTRAMUSCULAR | Status: AC
  Administered 2016-06-10: 10 mg via INTRAVENOUS
  Filled 2016-06-10: qty 2

## 2016-06-10 NOTE — ED Triage Notes (Signed)
Reports headache and chest pain for 2 days.  Worse with coughing and deep breathing. Skin w/d.

## 2016-06-10 NOTE — ED Provider Notes (Signed)
[facility] Emergency Department Provider Note    ____________________________________________   I have reviewed the triage vital signs and the nursing notes.   HISTORY  Chief Complaint Migraine and Chest Pain   History limited by: Not Limited   HPI Mark [name] is a 45 y.o. male who presents to the emergency department today because of concern for headache and chest pain. The patient has been having chest pain and a headache for the past couple of days. He states that he has been having chest pain "for a minute" since he has been working two jobs. He states the chest pain is located in the right upper chest. It is sharp. In terms of his headache it is located in the front top of his head. He has headaches this severe from time to time but it sounds like they usually resolve on their own. He has tried taking aspirin without any relief. No vomiting. No fevers.   Past Medical History:  Diagnosis Date  . Asthma     There are no active problems to display for this patient.   History reviewed. No pertinent surgical history.  Prior to Admission medications   Medication Sig Start Date End Date Taking? Authorizing Provider  albuterol (PROVENTIL HFA;VENTOLIN HFA) 108 (90 Base) MCG/ACT inhaler Inhale 1-2 puffs into the lungs every 6 (six) hours as needed for wheezing or shortness of breath. 12/07/15   Jami L Hagler, PA-C  azithromycin (ZITHROMAX Z-PAK) 250 MG tablet Take 2 tablets (500 mg) on  Day 1,  followed by 1 tablet (250 mg) once daily on Days 2 through 5. 12/07/15   Jami L Hagler, PA-C  chlorpheniramine-HYDROcodone (TUSSIONEX PENNKINETIC ER) 10-8 MG/5ML SUER Take 5 mLs by mouth every 12 (twelve) hours as needed for cough. 12/07/15   Jami L Hagler, PA-C  cyclobenzaprine (FLEXERIL) 10 MG tablet Take 1 tablet (10 mg total) by mouth every 8 (eight) hours as needed for muscle spasms. 04/27/15   Governor Rooks, MD  HYDROcodone-acetaminophen (NORCO/VICODIN) 5-325 MG per  tablet Take 1 tablet by mouth every 6 (six) hours as needed for moderate pain. 04/27/15   Governor Rooks, MD  ibuprofen (ADVIL,MOTRIN) 600 MG tablet Take 1 tablet (600 mg total) by mouth every 8 (eight) hours as needed. 04/27/15   Governor Rooks, MD  oxymetazoline (AFRIN) 0.05 % nasal spray Place 2 sprays into both nostrils 2 (two) times daily as needed (epistaxis). 12/13/15   Delorise Royals Cuthriell, PA-C    Allergies Peanuts [peanut oil]  No family history on file.  Social History Social History  Substance Use Topics  . Smoking status: Current Every Day Smoker  . Smokeless tobacco: Never Used  . Alcohol use Yes    Review of Systems  Constitutional: Negative for fever. Cardiovascular: Positive right upper chest pain. Respiratory: Negative for shortness of breath. Gastrointestinal: Negative for abdominal pain, vomiting and diarrhea. Neurological: Positive for headache.  10-point ROS otherwise negative.  ____________________________________________   PHYSICAL EXAM:  VITAL SIGNS: ED Triage Vitals  Enc Vitals Group     BP 06/10/16 0940 132/79     Pulse Rate 06/10/16 0940 86     Resp 06/10/16 0940 16     Temp 06/10/16 0940 98.1 F (36.7 C)     Temp Source 06/10/16 0940 Oral     SpO2 06/10/16 0940 96 %     Weight 06/10/16 0940 180 lb (81.6 kg)     Height 06/10/16 0940 5\' 8"  (1.727 m)  Head Circumference --      Peak Flow --      Pain Score 06/10/16 0941 7   Constitutional: Alert and oriented. Well appearing and in no distress. Eyes: Conjunctivae are normal. PERRL. Normal extraocular movements. ENT   Head: Normocephalic and atraumatic.   Nose: No congestion/rhinnorhea.   Mouth/Throat: Mucous membranes are moist.   Neck: No stridor. Hematological/Lymphatic/Immunilogical: No cervical lymphadenopathy. Cardiovascular: Normal rate, regular rhythm.  No murmurs, rubs, or gallops. Respiratory: Normal respiratory effort without tachypnea nor retractions. Breath sounds  are clear and equal bilaterally. No wheezes/rales/rhonchi. Gastrointestinal: Soft and nontender. No distention. There is no CVA tenderness. Genitourinary: Deferred Musculoskeletal: Normal range of motion in all extremities. No joint effusions.  No lower extremity tenderness nor edema. Neurologic:  Normal speech and language. No gross focal neurologic deficits are appreciated.  Skin:  Skin is warm, dry and intact. No rash noted. Psychiatric: Mood and affect are normal. Speech and behavior are normal. Patient exhibits appropriate insight and judgment.  ____________________________________________    LABS (pertinent positives/negatives)  Labs Reviewed  CBC WITH DIFFERENTIAL/PLATELET - Abnormal; Notable for the following:       Result Value   MCHC 36.1 (*)    Neutro Abs 6.8 (*)    All other components within normal limits  COMPREHENSIVE METABOLIC PANEL - Abnormal; Notable for the following:    Sodium 134 (*)    Glucose, Bld 109 (*)    All other components within normal limits  TROPONIN I    ____________________________________________   EKG  I, Phineas Semen, attending physician, personally viewed and interpreted this EKG  EKG Time: 0940 Rate: 85 Rhythm: normal sinus rhythm Axis: left axis deviation Intervals: qtc 433 QRS: narrow ST changes: no st elevation Impression: abnormal ekg  ____________________________________________    RADIOLOGY  CT head IMPRESSION: Study within normal limits.  ____________________________________________   PROCEDURES  Procedures  ____________________________________________   INITIAL IMPRESSION / ASSESSMENT AND PLAN / ED COURSE  Pertinent labs & imaging results that were available during my care of the patient were reviewed by me and considered in my medical decision making (see chart for details).  Patient presented for chest pain and headache. Has had both for the past two days but has had them intermittently for a while.  No concerning findings on physical exam. Will get head ct given patient has not had any imaging for his headaches. Will give fluids and compazine.  Clinical Course   Patient states that his headache has greatly improved at this point. CT head negative. Will discharge with prescription with compazine. ____________________________________________   FINAL CLINICAL IMPRESSION(S) / ED DIAGNOSES  Final diagnoses:  Headache, unspecified headache type  Right-sided chest pain     Note: This dictation was prepared with Dragon dictation. Any transcriptional errors that result from this process are unintentional    Phineas Semen, MD 06/10/16 1413

## 2016-06-10 NOTE — ED Notes (Addendum)
Pt c/o chest pain and headache,  Denies any SOB or n/v . MD at bedside

## 2017-12-30 ENCOUNTER — Emergency Department: Payer: Self-pay

## 2017-12-30 ENCOUNTER — Encounter: Payer: Self-pay | Admitting: Emergency Medicine

## 2017-12-30 ENCOUNTER — Inpatient Hospital Stay
Admission: EM | Admit: 2017-12-30 | Discharge: 2017-12-31 | DRG: 915 | Disposition: A | Discharge status: Home / Self Care | Payer: Self-pay | Attending: Internal Medicine | Admitting: Internal Medicine

## 2017-12-30 ENCOUNTER — Other Ambulatory Visit: Payer: Self-pay

## 2017-12-30 DIAGNOSIS — T886XXA Anaphylactic reaction due to adverse effect of correct drug or medicament properly administered, initial encounter: Principal | ICD-10-CM | POA: Diagnosis present

## 2017-12-30 DIAGNOSIS — T782XXA Anaphylactic shock, unspecified, initial encounter: Secondary | ICD-10-CM | POA: Diagnosis present

## 2017-12-30 DIAGNOSIS — F17200 Nicotine dependence, unspecified, uncomplicated: Secondary | ICD-10-CM | POA: Diagnosis present

## 2017-12-30 DIAGNOSIS — Z91041 Radiographic dye allergy status: Secondary | ICD-10-CM

## 2017-12-30 DIAGNOSIS — J4520 Mild intermittent asthma, uncomplicated: Secondary | ICD-10-CM | POA: Diagnosis present

## 2017-12-30 DIAGNOSIS — G9341 Metabolic encephalopathy: Secondary | ICD-10-CM | POA: Diagnosis present

## 2017-12-30 DIAGNOSIS — K5732 Diverticulitis of large intestine without perforation or abscess without bleeding: Secondary | ICD-10-CM | POA: Diagnosis present

## 2017-12-30 DIAGNOSIS — T508X5A Adverse effect of diagnostic agents, initial encounter: Secondary | ICD-10-CM | POA: Diagnosis present

## 2017-12-30 DIAGNOSIS — K5792 Diverticulitis of intestine, part unspecified, without perforation or abscess without bleeding: Secondary | ICD-10-CM

## 2017-12-30 DIAGNOSIS — D72829 Elevated white blood cell count, unspecified: Secondary | ICD-10-CM | POA: Diagnosis present

## 2017-12-30 LAB — URINALYSIS, COMPLETE (UACMP) WITH MICROSCOPIC
BACTERIA UA: NONE SEEN
BILIRUBIN URINE: NEGATIVE
Glucose, UA: NEGATIVE mg/dL
Hgb urine dipstick: NEGATIVE
Ketones, ur: NEGATIVE mg/dL
LEUKOCYTES UA: NEGATIVE
Nitrite: NEGATIVE
Protein, ur: 30 mg/dL — AB
Specific Gravity, Urine: 1.016 (ref 1.005–1.030)
pH: 6 (ref 5.0–8.0)

## 2017-12-30 LAB — COMPREHENSIVE METABOLIC PANEL
ALT: 22 U/L (ref 17–63)
AST: 21 U/L (ref 15–41)
Albumin: 4.5 g/dL (ref 3.5–5.0)
Alkaline Phosphatase: 79 U/L (ref 38–126)
Anion gap: 9 (ref 5–15)
BUN: 15 mg/dL (ref 6–20)
CHLORIDE: 103 mmol/L (ref 101–111)
CO2: 26 mmol/L (ref 22–32)
Calcium: 9.3 mg/dL (ref 8.9–10.3)
Creatinine, Ser: 1.01 mg/dL (ref 0.61–1.24)
GFR calc Af Amer: >60 mL/min (ref >60)
GFR calc non Af Amer: >60 mL/min (ref >60)
GLUCOSE: 103 mg/dL — AB (ref 65–99)
POTASSIUM: 4.3 mmol/L (ref 3.5–5.1)
SODIUM: 138 mmol/L (ref 135–145)
Total Bilirubin: 0.7 mg/dL (ref 0.3–1.2)
Total Protein: 8.8 g/dL — ABNORMAL HIGH (ref 6.5–8.1)

## 2017-12-30 LAB — CBC
HEMATOCRIT: 47.4 % (ref 40.0–52.0)
Hemoglobin: 16.2 g/dL (ref 13.0–18.0)
MCH: 31.1 pg (ref 26.0–34.0)
MCHC: 34.1 g/dL (ref 32.0–36.0)
MCV: 91 fL (ref 80.0–100.0)
Platelets: 236 10*3/uL (ref 150–440)
RBC: 5.21 MIL/uL (ref 4.40–5.90)
RDW: 13.9 % (ref 11.5–14.5)
WBC: 14.8 10*3/uL — ABNORMAL HIGH (ref 3.8–10.6)

## 2017-12-30 LAB — GLUCOSE, CAPILLARY: Glucose-Capillary: 158 mg/dL — ABNORMAL HIGH (ref 65–99)

## 2017-12-30 LAB — LIPASE, BLOOD: Lipase: 21 U/L (ref 11–51)

## 2017-12-30 LAB — MRSA PCR SCREENING: MRSA by PCR: NEGATIVE

## 2017-12-30 MED ORDER — ACETAMINOPHEN 325 MG PO TABS: 650.0000 mg | ORAL_TABLET | Freq: Four times a day (QID) | ORAL | Status: DC | PRN

## 2017-12-30 MED ORDER — LORAZEPAM 2 MG/ML IJ SOLN
2.0000 mg | Freq: Once | INTRAMUSCULAR | Status: AC
  Administered 2017-12-30: 2 mg via INTRAVENOUS

## 2017-12-30 MED ORDER — ETOMIDATE 2 MG/ML IV SOLN: 20.0000 mg | Freq: Once | INTRAVENOUS | Status: DC

## 2017-12-30 MED ORDER — EPINEPHRINE 0.3 MG/0.3ML IJ SOAJ
0.3000 mg | Freq: Once | INTRAMUSCULAR | Status: AC
  Administered 2017-12-30: 0.3 mg via INTRAMUSCULAR

## 2017-12-30 MED ORDER — LORAZEPAM 2 MG/ML IJ SOLN
INTRAMUSCULAR | Status: AC
  Filled 2017-12-30: qty 1

## 2017-12-30 MED ORDER — ALBUTEROL SULFATE (2.5 MG/3ML) 0.083% IN NEBU: 2.5000 mg | INHALATION_SOLUTION | RESPIRATORY_TRACT | Status: DC | PRN

## 2017-12-30 MED ORDER — SUCCINYLCHOLINE CHLORIDE 20 MG/ML IJ SOLN: 100.0000 mg | Freq: Once | INTRAMUSCULAR | Status: DC

## 2017-12-30 MED ORDER — ONDANSETRON HCL 4 MG/2ML IJ SOLN
INTRAMUSCULAR | Status: AC
  Administered 2017-12-30: 4 mg
  Filled 2017-12-30: qty 2

## 2017-12-30 MED ORDER — MORPHINE SULFATE (PF) 4 MG/ML IV SOLN
INTRAVENOUS | Status: AC
  Administered 2017-12-30: 4 mg via INTRAVENOUS
  Filled 2017-12-30: qty 1

## 2017-12-30 MED ORDER — ONDANSETRON HCL 4 MG/2ML IJ SOLN
INTRAMUSCULAR | Status: AC
  Administered 2017-12-30: 4 mg via INTRAVENOUS
  Filled 2017-12-30: qty 2

## 2017-12-30 MED ORDER — EPINEPHRINE 0.3 MG/0.3ML IJ SOAJ
0.3000 mg | Freq: Once | INTRAMUSCULAR | Status: AC
  Administered 2017-12-30: 0.3 mg via INTRAMUSCULAR
  Filled 2017-12-30: qty 0.3

## 2017-12-30 MED ORDER — MORPHINE SULFATE (PF) 4 MG/ML IV SOLN
4.0000 mg | Freq: Once | INTRAVENOUS | Status: AC
  Administered 2017-12-30: 4 mg via INTRAVENOUS

## 2017-12-30 MED ORDER — DEXTROSE 5 % IV SOLN
0.5000 ug/min | INTRAVENOUS | Status: DC
  Administered 2017-12-30: 7 ug/min via INTRAVENOUS
  Filled 2017-12-30: qty 4

## 2017-12-30 MED ORDER — ONDANSETRON HCL 4 MG PO TABS: 4.0000 mg | ORAL_TABLET | Freq: Four times a day (QID) | ORAL | Status: DC | PRN

## 2017-12-30 MED ORDER — ORAL CARE MOUTH RINSE: 15.0000 mL | Freq: Two times a day (BID) | OROMUCOSAL | Status: DC

## 2017-12-30 MED ORDER — EPINEPHRINE PF 1 MG/ML IJ SOLN
1.0000 mg | Freq: Once | INTRAMUSCULAR | Status: AC
  Administered 2017-12-30: 1 mg via INTRAVENOUS

## 2017-12-30 MED ORDER — EPINEPHRINE 0.15 MG/0.3ML IJ SOAJ
INTRAMUSCULAR | Status: AC
  Filled 2017-12-30: qty 0.3

## 2017-12-30 MED ORDER — SODIUM CHLORIDE 0.9 % IV BOLUS (SEPSIS)
1000.0000 mL | Freq: Once | INTRAVENOUS | Status: AC
  Administered 2017-12-30: 1000 mL via INTRAVENOUS

## 2017-12-30 MED ORDER — METRONIDAZOLE IN NACL 5-0.79 MG/ML-% IV SOLN
500.0000 mg | Freq: Three times a day (TID) | INTRAVENOUS | Status: DC
  Administered 2017-12-30 – 2017-12-31 (×3): 500 mg via INTRAVENOUS
  Filled 2017-12-30 (×5): qty 100

## 2017-12-30 MED ORDER — IOPAMIDOL (ISOVUE-300) INJECTION 61%
100.0000 mL | Freq: Once | INTRAVENOUS | Status: AC | PRN
  Administered 2017-12-30: 100 mL via INTRAVENOUS
  Filled 2017-12-30: qty 100

## 2017-12-30 MED ORDER — PIPERACILLIN-TAZOBACTAM 3.375 G IVPB 30 MIN
3.3750 g | Freq: Once | INTRAVENOUS | Status: AC
  Administered 2017-12-30: 3.375 g via INTRAVENOUS
  Filled 2017-12-30: qty 50

## 2017-12-30 MED ORDER — ONDANSETRON HCL 4 MG/2ML IJ SOLN
4.0000 mg | Freq: Once | INTRAMUSCULAR | Status: AC
  Administered 2017-12-30: 4 mg via INTRAVENOUS

## 2017-12-30 MED ORDER — INFLUENZA VAC SPLIT QUAD 0.5 ML IM SUSY: 0.5000 mL | PREFILLED_SYRINGE | INTRAMUSCULAR | Status: DC

## 2017-12-30 MED ORDER — ENOXAPARIN SODIUM 40 MG/0.4ML ~~LOC~~ SOLN
40.0000 mg | SUBCUTANEOUS | Status: DC
  Administered 2017-12-30: 40 mg via SUBCUTANEOUS
  Filled 2017-12-30: qty 0.4

## 2017-12-30 MED ORDER — EPINEPHRINE 0.3 MG/0.3ML IJ SOAJ
INTRAMUSCULAR | Status: AC
  Filled 2017-12-30: qty 0.3

## 2017-12-30 MED ORDER — ACETAMINOPHEN 650 MG RE SUPP: 650.0000 mg | Freq: Four times a day (QID) | RECTAL | Status: DC | PRN

## 2017-12-30 MED ORDER — METHYLPREDNISOLONE SODIUM SUCC 40 MG IJ SOLR
40.0000 mg | Freq: Four times a day (QID) | INTRAMUSCULAR | Status: DC
  Administered 2017-12-30: 40 mg via INTRAVENOUS
  Filled 2017-12-30: qty 1

## 2017-12-30 MED ORDER — ONDANSETRON HCL 4 MG/2ML IJ SOLN: 4.0000 mg | Freq: Four times a day (QID) | INTRAMUSCULAR | Status: DC | PRN

## 2017-12-30 MED ORDER — SODIUM CHLORIDE 0.9 % IV SOLN
INTRAVENOUS | Status: DC
  Administered 2017-12-30 (×2): via INTRAVENOUS

## 2017-12-30 MED ORDER — NICOTINE 14 MG/24HR TD PT24
14.0000 mg | MEDICATED_PATCH | Freq: Every day | TRANSDERMAL | Status: DC
  Administered 2017-12-31: 14 mg via TRANSDERMAL
  Filled 2017-12-30: qty 1

## 2017-12-30 MED ORDER — DIPHENHYDRAMINE HCL 50 MG/ML IJ SOLN
INTRAMUSCULAR | Status: AC
  Administered 2017-12-30: 50 mg
  Filled 2017-12-30: qty 1

## 2017-12-30 NOTE — Progress Notes (Signed)
CHIEF COMPLAINT:   Chief Complaint  Patient presents with  . Abdominal Pain  . Emesis    Subjective  47 y.o. male with a known history of asthma.  The patient presented to ED with 2 weeks of intermittent lower abdominal pain which has been worsening with 1 episode of nonbloody emesis this morning.    He got abdominal CT with contrast but developed anaphylaxis, with swelling lips, difficulty breathing, syncope and hypotension.   She was treated with EpiPen and then started epinephrine drip.   His mental status is better after starting epinephrine drip.    CT scan of the abdomen showed Focal diverticulitis in the distal descending colon without abscess or microperforation.  Patient more alert and awake now, awaiting GI consult recs KEEP NPO     Review of Systems:  Gen:  Denies  fever, sweats, chills weigh loss  HEENT: Denies blurred vision, double vision, ear pain, eye pain, hearing loss, nose bleeds, sore throat Cardiac:  No dizziness, chest pain or heaviness, chest tightness,edema Resp:   -cough -sputum porduction, -shortness of breath,-wheezing, -hemoptysis,  Gi: +stomach pain, +nausea  Ext:   Denies Joint pain, stiffness or swelling Skin: Denies  skin rash, easy bruising or bleeding or hives Endoc:  Denies polyuria, polydipsia , polyphagia or weight change Psych:   Denies depression, insomnia or hallucinations  Other:  All other systems negative   Objective   Examination:  General exam: Appears calm and comfortable  Respiratory system: Clear to auscultation. Respiratory effort normal. HEENT: [place]/AT, PERRLA, no thrush, no stridor. Cardiovascular system: S1 & S2 heard, RRR. No JVD, murmurs, rubs, gallops or clicks. No pedal edema. Gastrointestinal system: Abdomen distended, +tender lower quads. No organomegaly or masses felt. faint bowel sounds heard. Central nervous system: Alert and oriented. No focal neurological deficits. Extremities: Symmetric 5 x 5 power. Skin:  No rashes, lesions or ulcers Psychiatry: Judgement and insight appear normal. Mood & affect appropriate.   VITALS:  height is 5\' 9"  (1.753 m) and weight is 183 lb 6.8 oz (83.2 kg). His oral temperature is 97.7 F (36.5 C). His blood pressure is 160/82 (abnormal) and his pulse is 124 (abnormal). His respiration is 21 (abnormal) and oxygen saturation is 95%.   I personally reviewed Labs under Results section.  Radiology Reports Ct Abdomen Pelvis W Contrast  Result Date: 12/30/2017 CLINICAL DATA:  Abdominal pain EXAM: CT ABDOMEN AND PELVIS WITH CONTRAST TECHNIQUE: Multidetector CT imaging of the abdomen and pelvis was performed using the standard protocol following bolus administration of intravenous contrast. By report, the patient developed allergic reaction following the intravenous contrast bolus which included sneezing, itching, and throat swelling. The patient received epinephrine by the emergency department physician. CONTRAST:  ISOVUE-300 IOPAMIDOL (ISOVUE-300) INJECTION 61% COMPARISON:  None. FINDINGS: Lower chest: There is atelectatic change in the posterior lung base on the right. Lung bases otherwise are clear. There is a small hiatal hernia. Hepatobiliary: No focal liver lesions are apparent. Gallbladder wall is not appreciably thickened. No biliary duct dilatation. Pancreas: There is no pancreatic mass or inflammatory focus. Spleen: No splenic lesions are evident. Adrenals/Urinary Tract: Adrenals bilaterally appear normal. Kidneys bilaterally show no evident mass or hydronephrosis on either side. There is no renal or ureteral calculus on either side. Urinary bladder is midline with wall thickness within normal limits. Stomach/Bowel: There is wall thickening in the distal descending colon extending over several centimeters with fluid tracking into the lateral conal fascia on the left. There are irregular diverticula  in this area. There is no evidence of perforation or abscess in this area  of distal sigmoid diverticulitis. There is no other bowel wall thickening or mesenteric thickening elsewhere. No bowel obstruction. No free air or portal venous air. Vascular/Lymphatic: There are foci of atherosclerotic calcification and peripheral plaque in the aorta. No abdominal aortic aneurysm evident. Major mesenteric vessels appear patent. No adenopathy is evident in the abdomen or pelvis. Reproductive: Prostate and seminal vesicles appear normal in size and contour. No evident pelvic mass. Other: Appendix appears normal. No abscess or ascites is evident in the abdomen or pelvis. Musculoskeletal: There are no blastic or lytic bone lesions. There is degenerative change in the superior sacroiliac joint regions bilaterally. No intramuscular or abdominal wall lesions are evident. IMPRESSION: 1. Apparent allergic reaction to iodinated contrast material which required epinephrine administration by the emergency department physician. Documentation of major allergic reaction to iodinated contrast should be documented as part of patient's allergic history. 2. Focal diverticulitis in the distal descending colon without abscess or microperforation. 3. Bowel elsewhere appears unremarkable. No bowel obstruction. No free air. No abscess. Appendix appears normal. 4.  No renal or ureteral calculus.  No hydronephrosis. 5.  Small hiatal hernia. 6.  Aortic atherosclerosis. Aortic Atherosclerosis (ICD10-I70.0). Electronically Signed   By: Bretta BangWilliam  Woodruff III M.D.   On: 12/30/2017 13:57       Assessment/Plan:  47 yo AAM admitted to [facility] for acute anaphylaxis from IV contrast dye with dx of  focal diverticulitis  1.wean off epi drip 2.KEEP NPO until GI sees patient 3.stop steroids 4.continue IV abx  Code Status: FULL Remain STEP DOWN STATUS     Samie Reasons Santiago Gladavid Abdinasir Spadafore, M.D.  [facility]  Medical Director [facility] Medical Director [facility]

## 2017-12-30 NOTE — ED Triage Notes (Signed)
Pt to ed via pov with c/o abd pain and vomiting starting last night.

## 2017-12-30 NOTE — ED Provider Notes (Signed)
[facility] Emergency Department Provider Note  ____________________________________________  Time seen: Approximately 1:18 PM  I have reviewed the triage vital signs and the nursing notes.   HISTORY  Chief Complaint Abdominal Pain and Emesis   HPI Mark [name] is a 47 y.o. male with no significant past medical history who presents for evaluation of abdominal pain. Patient reports 2 weeks of intermittent lower abdominal pain which became severeand constant this morning after patient had one episode of nonbloody nonbilious emesis. Currently he is complaining of sharp 9 out of 10 constant lower abdominal pain, non radiating associated with nausea. Had one episode of NBNB emesis this am. Normal BM yesterday. No constipation, diarrhea, melena, coffee-ground emesis, hematemesis, fever, chills, dysuria, hematuria. Patient denies ever having similar pain.  Past Medical History:  Diagnosis Date  . Asthma    Past Surgical History Abdominal wall hernia repaired with mesh in 2014   Prior to Admission medications   Medication Sig Start Date End Date Taking? Authorizing Provider  albuterol (PROVENTIL HFA;VENTOLIN HFA) 108 (90 Base) MCG/ACT inhaler Inhale 1-2 puffs into the lungs every 6 (six) hours as needed for wheezing or shortness of breath. 12/07/15   Hagler, Jami L, PA-C  azithromycin (ZITHROMAX Z-PAK) 250 MG tablet Take 2 tablets (500 mg) on  Day 1,  followed by 1 tablet (250 mg) once daily on Days 2 through 5. 12/07/15   Hagler, Jami L, PA-C  chlorpheniramine-HYDROcodone (TUSSIONEX PENNKINETIC ER) 10-8 MG/5ML SUER Take 5 mLs by mouth every 12 (twelve) hours as needed for cough. 12/07/15   Hagler, Jami L, PA-C  cyclobenzaprine (FLEXERIL) 10 MG tablet Take 1 tablet (10 mg total) by mouth every 8 (eight) hours as needed for muscle spasms. 04/27/15   Governor RooksLord, Rebecca, MD  HYDROcodone-acetaminophen (NORCO/VICODIN) 5-325 MG per tablet Take 1 tablet by mouth every 6 (six)  hours as needed for moderate pain. 04/27/15   Governor RooksLord, Rebecca, MD  ibuprofen (ADVIL,MOTRIN) 600 MG tablet Take 1 tablet (600 mg total) by mouth every 8 (eight) hours as needed. 04/27/15   Governor RooksLord, Rebecca, MD  oxymetazoline (AFRIN) 0.05 % nasal spray Place 2 sprays into both nostrils 2 (two) times daily as needed (epistaxis). 12/13/15   Cuthriell, Delorise RoyalsJonathan D, PA-C  prochlorperazine (COMPAZINE) 10 MG tablet Take 1 tablet (10 mg total) by mouth every 8 (eight) hours as needed (headache). 06/10/16   Phineas SemenGoodman, Graydon, MD    Allergies Contrast media [iodinated diagnostic agents] and Peanuts [peanut oil]  No family history on file.  Social History Social History   Tobacco Use  . Smoking status: Current Every Day Smoker  . Smokeless tobacco: Never Used  Substance Use Topics  . Alcohol use: Yes  . Drug use: Not on file    Review of Systems  Constitutional: Negative for fever. Eyes: Negative for visual changes. ENT: Negative for sore throat. Neck: No neck pain  Cardiovascular: Negative for chest pain. Respiratory: Negative for shortness of breath. Gastrointestinal: + lower abdominal pain, nausea, and vomiting. No diarrhea. Genitourinary: Negative for dysuria. Musculoskeletal: Negative for back pain. Skin: Negative for rash. Neurological: Negative for headaches, weakness or numbness. Psych: No SI or HI  ____________________________________________   PHYSICAL EXAM:  VITAL SIGNS: ED Triage Vitals  Enc Vitals Group     BP 12/30/17 1128 (!) 152/88     Pulse Rate 12/30/17 1128 85     Resp 12/30/17 1128 20     Temp 12/30/17 1128 98.8 F (37.1 C)     Temp  Source 12/30/17 1128 Oral     SpO2 12/30/17 1128 98 %     Weight 12/30/17 1129 180 lb (81.6 kg)     Height --      Head Circumference --      Peak Flow --      Pain Score 12/30/17 1129 10     Pain Loc --      Pain Edu? --      Excl. in GC? --     Constitutional: Alert and oriented. Well appearing and in no apparent  distress. HEENT:      Head: Normocephalic and atraumatic.         Eyes: Conjunctivae are normal. Sclera is non-icteric.       Mouth/Throat: Mucous membranes are moist.       Neck: Supple with no signs of meningismus. Cardiovascular: Regular rate and rhythm. No murmurs, gallops, or rubs. 2+ symmetrical distal pulses are present in all extremities. No JVD. Respiratory: Normal respiratory effort. Lungs are clear to auscultation bilaterally. No wheezes, crackles, or rhonchi.  Gastrointestinal: Soft, non tender, and non distended with positive bowel sounds. No rebound or guarding. Genitourinary: No CVA tenderness. Bilateral testicles are descended with no tenderness to palpation, bilateral positive cremasteric reflexes are present, no swelling or erythema of the scrotum. No evidence of inguinal hernia. Musculoskeletal: Nontender with normal range of motion in all extremities. No edema, cyanosis, or erythema of extremities. Neurologic: Normal speech and language. Face is symmetric. Moving all extremities. No gross focal neurologic deficits are appreciated. Skin: Skin is warm, dry and intact. No rash noted. Psychiatric: Mood and affect are normal. Speech and behavior are normal.  ____________________________________________   LABS (all labs ordered are listed, but only abnormal results are displayed)  Labs Reviewed  COMPREHENSIVE METABOLIC PANEL - Abnormal; Notable for the following components:      Result Value   Glucose, Bld 103 (*)    Total Protein 8.8 (*)    All other components within normal limits  CBC - Abnormal; Notable for the following components:   WBC 14.8 (*)    All other components within normal limits  URINALYSIS, COMPLETE (UACMP) WITH MICROSCOPIC - Abnormal; Notable for the following components:   Color, Urine YELLOW (*)    APPearance CLEAR (*)    Protein, ur 30 (*)    Squamous Epithelial / LPF 0-5 (*)    All other components within normal limits  LIPASE, BLOOD    ____________________________________________  EKG  ED ECG REPORT I, Nita Sickle, the attending physician, personally viewed and interpreted this ECG.  Sinus tachycardia , rate of 127, normal intervals, normal axis, ST depressions in inferior leads, no ST elevation. ____________________________________________  RADIOLOGY  I have personally reviewed the images performed during this visit and I agree with the Radiologist's read.   Interpretation by Radiologist:  Ct Abdomen Pelvis W Contrast  Result Date: 12/30/2017 CLINICAL DATA:  Abdominal pain EXAM: CT ABDOMEN AND PELVIS WITH CONTRAST TECHNIQUE: Multidetector CT imaging of the abdomen and pelvis was performed using the standard protocol following bolus administration of intravenous contrast. By report, the patient developed allergic reaction following the intravenous contrast bolus which included sneezing, itching, and throat swelling. The patient received epinephrine by the emergency department physician. CONTRAST:  ISOVUE-300 IOPAMIDOL (ISOVUE-300) INJECTION 61% COMPARISON:  None. FINDINGS: Lower chest: There is atelectatic change in the posterior lung base on the right. Lung bases otherwise are clear. There is a small hiatal hernia. Hepatobiliary: No focal liver lesions are  apparent. Gallbladder wall is not appreciably thickened. No biliary duct dilatation. Pancreas: There is no pancreatic mass or inflammatory focus. Spleen: No splenic lesions are evident. Adrenals/Urinary Tract: Adrenals bilaterally appear normal. Kidneys bilaterally show no evident mass or hydronephrosis on either side. There is no renal or ureteral calculus on either side. Urinary bladder is midline with wall thickness within normal limits. Stomach/Bowel: There is wall thickening in the distal descending colon extending over several centimeters with fluid tracking into the lateral conal fascia on the left. There are irregular diverticula in this area. There is  no evidence of perforation or abscess in this area of distal sigmoid diverticulitis. There is no other bowel wall thickening or mesenteric thickening elsewhere. No bowel obstruction. No free air or portal venous air. Vascular/Lymphatic: There are foci of atherosclerotic calcification and peripheral plaque in the aorta. No abdominal aortic aneurysm evident. Major mesenteric vessels appear patent. No adenopathy is evident in the abdomen or pelvis. Reproductive: Prostate and seminal vesicles appear normal in size and contour. No evident pelvic mass. Other: Appendix appears normal. No abscess or ascites is evident in the abdomen or pelvis. Musculoskeletal: There are no blastic or lytic bone lesions. There is degenerative change in the superior sacroiliac joint regions bilaterally. No intramuscular or abdominal wall lesions are evident. IMPRESSION: 1. Apparent allergic reaction to iodinated contrast material which required epinephrine administration by the emergency department physician. Documentation of major allergic reaction to iodinated contrast should be documented as part of patient's allergic history. 2. Focal diverticulitis in the distal descending colon without abscess or microperforation. 3. Bowel elsewhere appears unremarkable. No bowel obstruction. No free air. No abscess. Appendix appears normal. 4.  No renal or ureteral calculus.  No hydronephrosis. 5.  Small hiatal hernia. 6.  Aortic atherosclerosis. Aortic Atherosclerosis (ICD10-I70.0). Electronically Signed   By: Bretta Bang III M.D.   On: 12/30/2017 13:57      ____________________________________________   PROCEDURES  Procedure(s) performed: None Procedures Critical Care performed: yes  CRITICAL CARE Performed by: Nita Sickle  ?  Total critical care time: 45 min  Critical care time was exclusive of separately billable procedures and treating other patients.  Critical care was necessary to treat or prevent imminent or  life-threatening deterioration.  Critical care was time spent personally by me on the following activities: development of treatment plan with patient and/or surrogate as well as nursing, discussions with consultants, evaluation of patient's response to treatment, examination of patient, obtaining history from patient or surrogate, ordering and performing treatments and interventions, ordering and review of laboratory studies, ordering and review of radiographic studies, pulse oximetry and re-evaluation of patient's condition.  ____________________________________________   INITIAL IMPRESSION / ASSESSMENT AND PLAN / ED COURSE   47 y.o. male with no significant past medical history who presents for evaluation of sharp lower constant abdominal pain associated with nausea and 1 episode of nonbloody nonbilious emesis. Patient is well-appearing, in no distress, has normal vital signs, abdomen is soft and I am unable to reproduce the tenderness on exam, there is no rebound or guarding, GU exam is normal. Differential diagnoses including diverticulitis, colitis, UTI, kidney stone, SBO, complications from prior mesh placement. Labs show leukocytosis with white count of 14.8. Normal CMP and lipase and urinalysis. We'll give morphine and Zofran for symptom relief and sent patient for CT abdomen and pelvis.    _________________________ 2:21 PM on 12/30/2017 -----------------------------------------  Patient returned from CT abdomen and pelvis complaining of feeling like his lips were swelling and difficulty  breathing. Unfortunately patient developed anaphylaxis from the IV contrast. He was given an EpiPen. Had brief improvement of his symptoms however after that had a syncopal episode. Patient received another EpiPen and was moved to [place] side where he was found to be hypotensive with systolics in the 70s. He was started on an epinephrine drip. His mental status improved after being started on the drip right  before I was about to intubate him. With improvement of the mental status and stabilization of the vital signs intubation was withheld. Patient is currently on an epi drip. I have discussed with the hospitalist for admission to the ICU. Patient has also received Solu-Medrol, Benadryl, fluids, and Pepcid IV. His CT shows a complicated diverticulitis for which he will be given Zosyn.   As part of my medical decision making, I reviewed the following data within the electronic MEDICAL RECORD NUMBER Nursing notes reviewed and incorporated, Labs reviewed , EKG interpreted , Old chart reviewed, Radiograph reviewed , Discussed with admitting physician , Notes from prior ED visits and [place] Controlled Substance Database    Pertinent labs & imaging results that were available during my care of the patient were reviewed by me and considered in my medical decision making (see chart for details).    ____________________________________________   FINAL CLINICAL IMPRESSION(S) / ED DIAGNOSES  Final diagnoses:  Anaphylactic shock  Diverticulitis      NEW MEDICATIONS STARTED DURING THIS VISIT:  ED Discharge Orders    None       Note:  This document was prepared using Dragon voice recognition software and may include unintentional dictation errors.    Don Perking, [place], MD 12/30/17 [phone]

## 2017-12-30 NOTE — Code Documentation (Signed)
Pt bagged by MD Don PerkingVeronese

## 2017-12-30 NOTE — ED Notes (Signed)
Pt taken to room 4 with Dr. Don PerkingVeronese, Jeannett SeniorStephen RN and Carollee HerterShannon RN.

## 2017-12-30 NOTE — ED Notes (Signed)
Pt presented back from CT with 2 CT techs. Pt voice hoarse and stating that he felt like throat was closing. Dr. Don Perkingveronese at bedside with this RN.

## 2017-12-30 NOTE — ED Notes (Signed)
Pt making appropriate responses, oxygen saturation 96%, Heart rate 108, blood pressure 130/67

## 2017-12-30 NOTE — ED Notes (Signed)
Pt taken to CT.

## 2017-12-30 NOTE — ED Notes (Signed)
Alert, oriented, ambulatory.   States lower abd pain that started yest but worse this AM. States vomited x 1. Still has abd organs. Denies diarrhea.  No distress noted at current.

## 2017-12-30 NOTE — ED Notes (Signed)
Pt began seizing again after standing up when informed not to stand up to urinate. Pt not following commands. Dr. Don PerkingVeronese, Carollee HerterShannon RN, Jeannett SeniorStephen RN and this RN at bedside trying to keep pt in bed.

## 2017-12-30 NOTE — ED Notes (Signed)
Pt stated he needed to urinate, informed not to stand up. Informed to sit on side of bed and use urinal. Hooked up to cardiac monitor, ST noted on monitor with 29 resp. When pt stood up, eyes went into back of head and began seizing. Pt helped to lay back in bed by this RN and Bennetta LaosStephen RN. Dr. Don PerkingVeronese called to bedside.

## 2017-12-30 NOTE — H&P (Addendum)
[employer] - [place] [facility]   PATIENT NAME: Mark [name]    MR#:  [id number]  DATE OF BIRTH:  04-01-1971  DATE OF ADMISSION:  12/30/2017  PRIMARY CARE PHYSICIAN: [facility]   REQUESTING/REFERRING PHYSICIAN: Nita Sickle, MD  CHIEF COMPLAINT:   Chief Complaint  Patient presents with  . Abdominal Pain  . Emesis   Abdominal pain HISTORY OF PRESENT ILLNESS:  Mark [name]  is a 47 y.o. male with a known history of asthma.  The patient presented to ED with 2 weeks of intermittent lower abdominal pain which has been worsening with 1 episode of nonbloody emesis this morning.  He got abdominal CT with contrast but developed anaphylaxis, with swelling lips, difficulty breathing, syncope and hypotension.  She is treated with EpiPen and then started epinephrine drip.  His mental status is better after starting epinephrine drip.  CAT scan of the abdomen showed Focal diverticulitis in the distal descending colon without abscess or microperforation.  PAST MEDICAL HISTORY:   Past Medical History:  Diagnosis Date  . Asthma     PAST SURGICAL HISTORY:  History reviewed. No pertinent surgical history.  unable to obtain.  SOCIAL HISTORY:   Social History   Tobacco Use  . Smoking status: Current Every Day Smoker  . Smokeless tobacco: Never Used  Substance Use Topics  . Alcohol use: Yes    FAMILY HISTORY:  No family history on file.  Unable to obtain.  DRUG ALLERGIES:   Allergies  Allergen Reactions  . Contrast Media [Iodinated Diagnostic Agents] Anaphylaxis    Anaphylactic shock.  Sneezing, itching, and throat swelling following IV contrast media bolus today, 12/30/17. Epi-pen given. Witness: Patton Salles, Lonell Face, Dr. Don Perking.  Marland Kitchen Peanuts [Peanut Oil]     Abdominal pain      REVIEW OF SYSTEMS:   Review of Systems  Unable to perform ROS: Mental status change    MEDICATIONS AT HOME:   Prior to Admission medications    Medication Sig Start Date End Date Taking? Authorizing Provider  albuterol (PROVENTIL HFA;VENTOLIN HFA) 108 (90 Base) MCG/ACT inhaler Inhale 1-2 puffs into the lungs every 6 (six) hours as needed for wheezing or shortness of breath. 12/07/15   Hagler, Jami L, PA-C  azithromycin (ZITHROMAX Z-PAK) 250 MG tablet Take 2 tablets (500 mg) on  Day 1,  followed by 1 tablet (250 mg) once daily on Days 2 through 5. Patient not taking: Reported on 12/30/2017 12/07/15   Hagler, Jami L, PA-C  chlorpheniramine-HYDROcodone (TUSSIONEX PENNKINETIC ER) 10-8 MG/5ML SUER Take 5 mLs by mouth every 12 (twelve) hours as needed for cough. Patient not taking: Reported on 12/30/2017 12/07/15   Hagler, Jami L, PA-C  cyclobenzaprine (FLEXERIL) 10 MG tablet Take 1 tablet (10 mg total) by mouth every 8 (eight) hours as needed for muscle spasms. Patient not taking: Reported on 12/30/2017 04/27/15   Governor Rooks, MD  HYDROcodone-acetaminophen (NORCO/VICODIN) 5-325 MG per tablet Take 1 tablet by mouth every 6 (six) hours as needed for moderate pain. Patient not taking: Reported on 12/30/2017 04/27/15   Governor Rooks, MD  ibuprofen (ADVIL,MOTRIN) 600 MG tablet Take 1 tablet (600 mg total) by mouth every 8 (eight) hours as needed. Patient not taking: Reported on 12/30/2017 04/27/15   Governor Rooks, MD  oxymetazoline (AFRIN) 0.05 % nasal spray Place 2 sprays into both nostrils 2 (two) times daily as needed (epistaxis). Patient not taking: Reported on 12/30/2017 12/13/15   Cuthriell, Delorise Royals, PA-C  prochlorperazine (COMPAZINE) 10 MG tablet Take 1 tablet (10 mg total) by mouth every 8 (eight) hours as needed (headache). Patient not taking: Reported on 12/30/2017 06/10/16   Phineas SemenGoodman, Graydon, MD      VITAL SIGNS:  Blood pressure 91/64, pulse 84, temperature 98.8 F (37.1 C), temperature source Oral, resp. rate 20, weight 180 lb (81.6 kg), SpO2 92 %.  PHYSICAL EXAMINATION:  Physical Exam  GENERAL:  47 y.o.-year-old patient lying in the bed  with no acute distress.  EYES: Pupils equal, round, reactive to light and accommodation. No scleral icterus. Extraocular muscles intact.  HEENT: Head atraumatic, normocephalic.  NECK:  Supple, no jugular venous distention. No thyroid enlargement, no tenderness.  LUNGS: Normal breath sounds bilaterally, no wheezing, rales,rhonchi or crepitation. No use of accessory muscles of respiration.  CARDIOVASCULAR: S1, S2 normal. No murmurs, rubs, or gallops.  ABDOMEN: Soft, nontender, nondistended. Bowel sounds present. No organomegaly or mass.  EXTREMITIES: No pedal edema, cyanosis, or clubbing.  NEUROLOGIC: The patient does not follow command, unable to exam..  PSYCHIATRIC: The patient is confused.  SKIN: No obvious rash, lesion, or ulcer.  Skin is red.  LABORATORY PANEL:   CBC Recent Labs  Lab 12/30/17 1131  WBC 14.8*  HGB 16.2  HCT 47.4  PLT 236   ------------------------------------------------------------------------------------------------------------------  Chemistries  Recent Labs  Lab 12/30/17 1131  NA 138  K 4.3  CL 103  CO2 26  GLUCOSE 103*  BUN 15  CREATININE 1.01  CALCIUM 9.3  AST 21  ALT 22  ALKPHOS 79  BILITOT 0.7   ------------------------------------------------------------------------------------------------------------------  Cardiac Enzymes No results for input(s): TROPONINI in the last 168 hours. ------------------------------------------------------------------------------------------------------------------  RADIOLOGY:  Ct Abdomen Pelvis W Contrast  Result Date: 12/30/2017 CLINICAL DATA:  Abdominal pain EXAM: CT ABDOMEN AND PELVIS WITH CONTRAST TECHNIQUE: Multidetector CT imaging of the abdomen and pelvis was performed using the standard protocol following bolus administration of intravenous contrast. By report, the patient developed allergic reaction following the intravenous contrast bolus which included sneezing, itching, and throat swelling. The  patient received epinephrine by the emergency department physician. CONTRAST:  100mL ISOVUE-300 IOPAMIDOL (ISOVUE-300) INJECTION 61% COMPARISON:  None. FINDINGS: Lower chest: There is atelectatic change in the posterior lung base on the right. Lung bases otherwise are clear. There is a small hiatal hernia. Hepatobiliary: No focal liver lesions are apparent. Gallbladder wall is not appreciably thickened. No biliary duct dilatation. Pancreas: There is no pancreatic mass or inflammatory focus. Spleen: No splenic lesions are evident. Adrenals/Urinary Tract: Adrenals bilaterally appear normal. Kidneys bilaterally show no evident mass or hydronephrosis on either side. There is no renal or ureteral calculus on either side. Urinary bladder is midline with wall thickness within normal limits. Stomach/Bowel: There is wall thickening in the distal descending colon extending over several centimeters with fluid tracking into the lateral conal fascia on the left. There are irregular diverticula in this area. There is no evidence of perforation or abscess in this area of distal sigmoid diverticulitis. There is no other bowel wall thickening or mesenteric thickening elsewhere. No bowel obstruction. No free air or portal venous air. Vascular/Lymphatic: There are foci of atherosclerotic calcification and peripheral plaque in the aorta. No abdominal aortic aneurysm evident. Major mesenteric vessels appear patent. No adenopathy is evident in the abdomen or pelvis. Reproductive: Prostate and seminal vesicles appear normal in size and contour. No evident pelvic mass. Other: Appendix appears normal. No abscess or ascites is evident in the abdomen or pelvis. Musculoskeletal: There are no blastic  or lytic bone lesions. There is degenerative change in the superior sacroiliac joint regions bilaterally. No intramuscular or abdominal wall lesions are evident. IMPRESSION: 1. Apparent allergic reaction to iodinated contrast material which  required epinephrine administration by the emergency department physician. Documentation of major allergic reaction to iodinated contrast should be documented as part of patient's allergic history. 2. Focal diverticulitis in the distal descending colon without abscess or microperforation. 3. Bowel elsewhere appears unremarkable. No bowel obstruction. No free air. No abscess. Appendix appears normal. 4.  No renal or ureteral calculus.  No hydronephrosis. 5.  Small hiatal hernia. 6.  Aortic atherosclerosis. Aortic Atherosclerosis (ICD10-I70.0). Electronically Signed   By: Bretta Bang III M.D.   On: 12/30/2017 13:57      IMPRESSION AND PLAN:   Anaphylaxis from the IV contrast The patient will be admitted to stepdown unit. Continue epinephrine drip, start IV Solu-Medrol.  NEB PRN.  Hypotension.  Continue normal saline IV.  Acute metabolic encephalopathy and syncope due to above.  Focal diverticulitis.  Start Flagyl IV.  Leukocytosis due to above.  Follow-up CBC. Asthma.  Stable.  NEB PRN. I discussed with Dr. Belia Heman. I called the patient's aunt, Ms. Poteat, nobody answered the phone. All the records are reviewed and case discussed with ED provider. Management plans discussed with the patient, family and they are in agreement.  CODE STATUS: Full code  TOTAL CRITICAL TIME TAKING CARE OF THIS PATIENT: 58 minutes.    Shaune Pollack M.D on 12/30/2017 at 2:53 PM  Between 7am to 6pm - Pager - [phone]  After 6pm go to [email or web address] - Scientist, research (life sciences) [facility]  Office  [phone]  CC: Primary care physician; [facility]   Note: This dictation was prepared with Dragon dictation along with smaller phrase technology. Any transcriptional errors that result from this process are unin

## 2017-12-30 NOTE — ED Notes (Signed)
Patient transported to CT 

## 2017-12-31 LAB — BASIC METABOLIC PANEL
ANION GAP: 9 (ref 5–15)
BUN: 13 mg/dL (ref 6–20)
CHLORIDE: 106 mmol/L (ref 101–111)
CO2: 23 mmol/L (ref 22–32)
Calcium: 8.2 mg/dL — ABNORMAL LOW (ref 8.9–10.3)
Creatinine, Ser: 0.89 mg/dL (ref 0.61–1.24)
GFR calc non Af Amer: >60 mL/min (ref >60)
Glucose, Bld: 101 mg/dL — ABNORMAL HIGH (ref 65–99)
POTASSIUM: 4 mmol/L (ref 3.5–5.1)
Sodium: 138 mmol/L (ref 135–145)

## 2017-12-31 LAB — CBC
HCT: 42.6 % (ref 40.0–52.0)
Hemoglobin: 14.3 g/dL (ref 13.0–18.0)
MCH: 30.7 pg (ref 26.0–34.0)
MCHC: 33.4 g/dL (ref 32.0–36.0)
MCV: 91.8 fL (ref 80.0–100.0)
Platelets: 235 10*3/uL (ref 150–440)
RBC: 4.65 MIL/uL (ref 4.40–5.90)
RDW: 14.1 % (ref 11.5–14.5)
WBC: 12 10*3/uL — ABNORMAL HIGH (ref 3.8–10.6)

## 2017-12-31 MED ORDER — NICOTINE 14 MG/24HR TD PT24: 14.0000 mg | MEDICATED_PATCH | Freq: Every day | TRANSDERMAL | 0 refills | Status: AC

## 2017-12-31 MED ORDER — LEVOFLOXACIN 750 MG PO TABS: 750.0000 mg | ORAL_TABLET | Freq: Every day | ORAL | 0 refills | Status: AC

## 2017-12-31 MED ORDER — POLYETHYLENE GLYCOL 3350 17 GM/SCOOP PO POWD
1.0000 | Freq: Once | ORAL | Status: DC
  Filled 2017-12-31: qty 255

## 2017-12-31 MED ORDER — METRONIDAZOLE 500 MG PO TABS: 500.0000 mg | ORAL_TABLET | Freq: Three times a day (TID) | ORAL | 0 refills | Status: AC

## 2017-12-31 MED ORDER — ALBUTEROL SULFATE (2.5 MG/3ML) 0.083% IN NEBU: 2.5000 mg | INHALATION_SOLUTION | RESPIRATORY_TRACT | Status: DC | PRN

## 2017-12-31 MED ORDER — METRONIDAZOLE 500 MG PO TABS
500.0000 mg | ORAL_TABLET | Freq: Three times a day (TID) | ORAL | Status: DC
  Filled 2017-12-31 (×2): qty 1

## 2017-12-31 MED ORDER — LEVOFLOXACIN 750 MG PO TABS
750.0000 mg | ORAL_TABLET | Freq: Every day | ORAL | Status: DC
  Administered 2017-12-31: 750 mg via ORAL
  Filled 2017-12-31: qty 1

## 2017-12-31 NOTE — Progress Notes (Signed)
Contacted by Camelia Engerri, US. Had paged and texted.

## 2017-12-31 NOTE — Plan of Care (Signed)
Patient discharged to home by Dr Juliene PinaMody, no need for GI consult, IVs DCd, bleeding controlled, he will leave with family, DC instructions given, understanding verbalized, belongings returned.  VSS on room air.  Ate regular diet for lunch, no ABD pain

## 2017-12-31 NOTE — Progress Notes (Signed)
Dr. Norma Fredricksonoledo returned call. Texted him RNs phone number.

## 2017-12-31 NOTE — Discharge Summary (Addendum)
[facility] [place] [facility]   PATIENT NAME: Mark [name]    MR#:  [id number]  DATE OF BIRTH:  07-08-1971  DATE OF ADMISSION:  12/30/2017 ADMITTING PHYSICIAN: Shaune Pollack, MD  DATE OF DISCHARGE: 12/31/2017  PRIMARY CARE PHYSICIAN: [facility], [facility]    ADMISSION DIAGNOSIS:  Diverticulitis [K57.92] Anaphylactic shock [T78.2XXA] Anaphylaxis [T78.2XXA]  DISCHARGE DIAGNOSIS:  Active Problems:   Anaphylaxis Acute diverticulitis  SECONDARY DIAGNOSIS:   Past Medical History:  Diagnosis Date  . Asthma     HOSPITAL COURSE:   47 year old male with mild intermittent asthma presented to ER with abdominal pain.  1. Anaphylaxis: Patient underwent abdominal CT with contrast and developed anaphylaxis. He started swelling and had shortness of breath. He was treated with epinephrine. His vitals are stable now. He is advised to make sure that everyone knows that he is allergic to contrast dye.  2. Acute diverticulitis: Patient's abdominal pain has subsided. He is tolerating his diet. He will continue treatment with Levaquin and Flagyl.  3. Mild intermittent asthma without exacerbation  4. Tobacco dependence: Patient is encouraged to quit smoking. Counseling was provided for 4 minutes.  DISCHARGE CONDITIONS AND DIET:   Stable for discharge on regular diet avoiding nuts and seeds  CONSULTS OBTAINED:  Treatment Team:  Erin Fulling, MD Stanton Kidney, MD  DRUG ALLERGIES:   Allergies  Allergen Reactions  . Contrast Media [Iodinated Diagnostic Agents] Anaphylaxis    Anaphylactic shock.  Sneezing, itching, and throat swelling following IV contrast media bolus today, 12/30/17. Epi-pen given. Witness: Patton Salles, Lonell Face, Dr. Don Perking.  Marland Kitchen Peanuts [Peanut Oil]     Abdominal pain      DISCHARGE MEDICATIONS:   Allergies as of 12/31/2017      Reactions   Contrast Media [iodinated Diagnostic Agents] Anaphylaxis   Anaphylactic  shock. Sneezing, itching, and throat swelling following IV contrast media bolus today, 12/30/17. Epi-pen given. Witness: Patton Salles, Lonell Face, Dr. Don Perking.   Peanuts [peanut Oil]    Abdominal pain       Medication List    STOP taking these medications   azithromycin 250 MG tablet Commonly known as:  ZITHROMAX Z-PAK   chlorpheniramine-HYDROcodone 10-8 MG/5ML Suer Commonly known as:  TUSSIONEX PENNKINETIC ER   cyclobenzaprine 10 MG tablet Commonly known as:  FLEXERIL   HYDROcodone-acetaminophen 5-325 MG tablet Commonly known as:  NORCO/VICODIN   ibuprofen 600 MG tablet Commonly known as:  ADVIL,MOTRIN   oxymetazoline 0.05 % nasal spray Commonly known as:  AFRIN   prochlorperazine 10 MG tablet Commonly known as:  COMPAZINE     TAKE these medications   albuterol 108 (90 Base) MCG/ACT inhaler Commonly known as:  PROVENTIL HFA;VENTOLIN HFA Inhale 1-2 puffs into the lungs every 6 (six) hours as needed for wheezing or shortness of breath.   levofloxacin 750 MG tablet Commonly known as:  LEVAQUIN Take 1 tablet (750 mg total) by mouth daily for 9 days. Start taking on:  01/01/2018   metroNIDAZOLE 500 MG tablet Commonly known as:  FLAGYL Take 1 tablet (500 mg total) by mouth every 8 (eight) hours for 9 days.   nicotine 14 mg/24hr patch Commonly known as:  NICODERM CQ - dosed in mg/24 hours Place 1 patch (14 mg total) onto the skin daily. Start taking on:  01/01/2018         Today   CHIEF COMPLAINT:  Patient doing well he is very hungry no abdominal pain or nausea   VITAL SIGNS:  Blood pressure 126/79, pulse 81, temperature 98 F (36.7 C), temperature source Oral, resp. rate 17, height 5\' 9"  (1.753 m), weight 83.2 kg (183 lb 6.8 oz), SpO2 96 %.   REVIEW OF SYSTEMS:  Review of Systems  Constitutional: Negative.  Negative for chills, fever and malaise/fatigue.  HENT: Negative.  Negative for ear discharge, ear pain, hearing loss, nosebleeds and sore  throat.   Eyes: Negative.  Negative for blurred vision and pain.  Respiratory: Negative.  Negative for cough, hemoptysis, shortness of breath and wheezing.   Cardiovascular: Negative.  Negative for chest pain, palpitations and leg swelling.  Gastrointestinal: Negative.  Negative for abdominal pain, blood in stool, diarrhea, nausea and vomiting.  Genitourinary: Negative.  Negative for dysuria.  Musculoskeletal: Negative.  Negative for back pain.  Skin: Negative.   Neurological: Negative for dizziness, tremors, speech change, focal weakness, seizures and headaches.  Endo/Heme/Allergies: Negative.  Does not bruise/bleed easily.  Psychiatric/Behavioral: Negative.  Negative for depression, hallucinations and suicidal ideas.     PHYSICAL EXAMINATION:  GENERAL:  47 y.o.-year-old patient lying in the bed with no acute distress.  NECK:  Supple, no jugular venous distention. No thyroid enlargement, no tenderness.  LUNGS: Normal breath sounds bilaterally, no wheezing, rales,rhonchi  No use of accessory muscles of respiration.  CARDIOVASCULAR: S1, S2 normal. No murmurs, rubs, or gallops.  ABDOMEN: Soft, non-tender, non-distended. Bowel sounds present. No organomegaly or mass.  EXTREMITIES: No pedal edema, cyanosis, or clubbing.  PSYCHIATRIC: The patient is alert and oriented x 3.  SKIN: No obvious rash, lesion, or ulcer.   DATA REVIEW:   CBC Recent Labs  Lab 12/31/17 0455  WBC 12.0*  HGB 14.3  HCT 42.6  PLT 235    Chemistries  Recent Labs  Lab 12/30/17 1131 12/31/17 0455  NA 138 138  K 4.3 4.0  CL 103 106  CO2 26 23  GLUCOSE 103* 101*  BUN 15 13  CREATININE 1.01 0.89  CALCIUM 9.3 8.2*  AST 21  --   ALT 22  --   ALKPHOS 79  --   BILITOT 0.7  --     Cardiac Enzymes No results for input(s): TROPONINI in the last 168 hours.  Microbiology Results  @MICRORSLT48 @  RADIOLOGY:  Ct Abdomen Pelvis W Contrast  Result Date: 12/30/2017 CLINICAL DATA:  Abdominal pain EXAM: CT  ABDOMEN AND PELVIS WITH CONTRAST TECHNIQUE: Multidetector CT imaging of the abdomen and pelvis was performed using the standard protocol following bolus administration of intravenous contrast. By report, the patient developed allergic reaction following the intravenous contrast bolus which included sneezing, itching, and throat swelling. The patient received epinephrine by the emergency department physician. CONTRAST:  100mL ISOVUE-300 IOPAMIDOL (ISOVUE-300) INJECTION 61% COMPARISON:  None. FINDINGS: Lower chest: There is atelectatic change in the posterior lung base on the right. Lung bases otherwise are clear. There is a small hiatal hernia. Hepatobiliary: No focal liver lesions are apparent. Gallbladder wall is not appreciably thickened. No biliary duct dilatation. Pancreas: There is no pancreatic mass or inflammatory focus. Spleen: No splenic lesions are evident. Adrenals/Urinary Tract: Adrenals bilaterally appear normal. Kidneys bilaterally show no evident mass or hydronephrosis on either side. There is no renal or ureteral calculus on either side. Urinary bladder is midline with wall thickness within normal limits. Stomach/Bowel: There is wall thickening in the distal descending colon extending over several centimeters with fluid tracking into the lateral conal fascia on the left. There are irregular diverticula in this area. There is no evidence of perforation  or abscess in this area of distal sigmoid diverticulitis. There is no other bowel wall thickening or mesenteric thickening elsewhere. No bowel obstruction. No free air or portal venous air. Vascular/Lymphatic: There are foci of atherosclerotic calcification and peripheral plaque in the aorta. No abdominal aortic aneurysm evident. Major mesenteric vessels appear patent. No adenopathy is evident in the abdomen or pelvis. Reproductive: Prostate and seminal vesicles appear normal in size and contour. No evident pelvic mass. Other: Appendix appears normal.  No abscess or ascites is evident in the abdomen or pelvis. Musculoskeletal: There are no blastic or lytic bone lesions. There is degenerative change in the superior sacroiliac joint regions bilaterally. No intramuscular or abdominal wall lesions are evident. IMPRESSION: 1. Apparent allergic reaction to iodinated contrast material which required epinephrine administration by the emergency department physician. Documentation of major allergic reaction to iodinated contrast should be documented as part of patient's allergic history. 2. Focal diverticulitis in the distal descending colon without abscess or microperforation. 3. Bowel elsewhere appears unremarkable. No bowel obstruction. No free air. No abscess. Appendix appears normal. 4.  No renal or ureteral calculus.  No hydronephrosis. 5.  Small hiatal hernia. 6.  Aortic atherosclerosis. Aortic Atherosclerosis (ICD10-I70.0). Electronically Signed   By: Bretta Bang III M.D.   On: 12/30/2017 13:57      Allergies as of 12/31/2017      Reactions   Contrast Media [iodinated Diagnostic Agents] Anaphylaxis   Anaphylactic shock. Sneezing, itching, and throat swelling following IV contrast media bolus today, 12/30/17. Epi-pen given. Witness: Patton Salles, Lonell Face, Dr. Don Perking.   Peanuts [peanut Oil]    Abdominal pain       Medication List    STOP taking these medications   azithromycin 250 MG tablet Commonly known as:  ZITHROMAX Z-PAK   chlorpheniramine-HYDROcodone 10-8 MG/5ML Suer Commonly known as:  TUSSIONEX PENNKINETIC ER   cyclobenzaprine 10 MG tablet Commonly known as:  FLEXERIL   HYDROcodone-acetaminophen 5-325 MG tablet Commonly known as:  NORCO/VICODIN   ibuprofen 600 MG tablet Commonly known as:  ADVIL,MOTRIN   oxymetazoline 0.05 % nasal spray Commonly known as:  AFRIN   prochlorperazine 10 MG tablet Commonly known as:  COMPAZINE     TAKE these medications   albuterol 108 (90 Base) MCG/ACT inhaler Commonly known  as:  PROVENTIL HFA;VENTOLIN HFA Inhale 1-2 puffs into the lungs every 6 (six) hours as needed for wheezing or shortness of breath.   levofloxacin 750 MG tablet Commonly known as:  LEVAQUIN Take 1 tablet (750 mg total) by mouth daily for 9 days. Start taking on:  01/01/2018   metroNIDAZOLE 500 MG tablet Commonly known as:  FLAGYL Take 1 tablet (500 mg total) by mouth every 8 (eight) hours for 9 days.   nicotine 14 mg/24hr patch Commonly known as:  NICODERM CQ - dosed in mg/24 hours Place 1 patch (14 mg total) onto the skin daily. Start taking on:  01/01/2018          Management plans discussed with the patient and he is in agreement. Stable for discharge home  Patient should follow up with pcp  CODE STATUS:     Code Status Orders  (From admission, onward)        Start     Ordered   12/30/17 1534  Full code  Continuous     12/30/17 1533    Code Status History    Date Active Date Inactive Code Status Order ID Comments User Context   This patient has  a current code status but no historical code status.      TOTAL TIME TAKING CARE OF THIS PATIENT: 38 minutes.    Note: This dictation was prepared with Dragon dictation along with smaller phrase technology. Any transcriptional errors that result from this process are unintentional.  Eevie Lapp M.D on 12/31/2017 at 11:56 AM  Between 7am to 6pm - Pager - [phone] After 6pm go to [email or web address] - Social research officer, government  [facility]  Office  [phone]  CC: Primary care physician; [facility], [facility]

## 2018-01-01 MED FILL — Medication: Qty: 1 | Status: AC

## 2018-09-22 ENCOUNTER — Encounter: Payer: Self-pay | Admitting: Emergency Medicine

## 2018-09-22 ENCOUNTER — Emergency Department: Payer: Self-pay

## 2018-09-22 ENCOUNTER — Emergency Department
Admission: EM | Admit: 2018-09-22 | Discharge: 2018-09-22 | Disposition: A | Discharge status: Home / Self Care | Payer: Self-pay | Attending: Emergency Medicine | Admitting: Emergency Medicine

## 2018-09-22 DIAGNOSIS — J40 Bronchitis, not specified as acute or chronic: Secondary | ICD-10-CM | POA: Insufficient documentation

## 2018-09-22 DIAGNOSIS — Z79899 Other long term (current) drug therapy: Secondary | ICD-10-CM | POA: Insufficient documentation

## 2018-09-22 DIAGNOSIS — F17200 Nicotine dependence, unspecified, uncomplicated: Secondary | ICD-10-CM | POA: Insufficient documentation

## 2018-09-22 DIAGNOSIS — Z91010 Allergy to peanuts: Secondary | ICD-10-CM | POA: Insufficient documentation

## 2018-09-22 LAB — BASIC METABOLIC PANEL
Anion gap: 7 (ref 5–15)
BUN: 11 mg/dL (ref 6–20)
CALCIUM: 8.9 mg/dL (ref 8.9–10.3)
CHLORIDE: 103 mmol/L (ref 98–111)
CO2: 27 mmol/L (ref 22–32)
CREATININE: 0.87 mg/dL (ref 0.61–1.24)
GFR calc Af Amer: >60 mL/min (ref >60)
GFR calc non Af Amer: >60 mL/min (ref >60)
GLUCOSE: 117 mg/dL — AB (ref 70–99)
Potassium: 3.6 mmol/L (ref 3.5–5.1)
Sodium: 137 mmol/L (ref 135–145)

## 2018-09-22 LAB — CBC
HEMATOCRIT: 45.4 % (ref 39.0–52.0)
Hemoglobin: 15.7 g/dL (ref 13.0–17.0)
MCH: 31.7 pg (ref 26.0–34.0)
MCHC: 34.6 g/dL (ref 30.0–36.0)
MCV: 91.7 fL (ref 80.0–100.0)
NRBC: 0 % (ref 0.0–0.2)
PLATELETS: 185 10*3/uL (ref 150–400)
RBC: 4.95 MIL/uL (ref 4.22–5.81)
RDW: 12.3 % (ref 11.5–15.5)
WBC: 6 10*3/uL (ref 4.0–10.5)

## 2018-09-22 LAB — TROPONIN I: Troponin I: <0.03 ng/mL (ref <0.03)

## 2018-09-22 MED ORDER — AZITHROMYCIN 250 MG PO TABS: ORAL_TABLET | ORAL | 0 refills | Status: AC

## 2018-09-22 MED ORDER — ALBUTEROL SULFATE HFA 108 (90 BASE) MCG/ACT IN AERS: 2.0000 | INHALATION_SPRAY | Freq: Four times a day (QID) | RESPIRATORY_TRACT | 2 refills | Status: DC | PRN

## 2018-09-22 MED ORDER — PREDNISONE 10 MG PO TABS: 10.0000 mg | ORAL_TABLET | Freq: Every day | ORAL | 0 refills | Status: DC

## 2018-09-22 MED ORDER — AZITHROMYCIN 500 MG PO TABS
500.0000 mg | ORAL_TABLET | Freq: Once | ORAL | Status: AC
  Administered 2018-09-22: 500 mg via ORAL
  Filled 2018-09-22: qty 1

## 2018-09-22 MED ORDER — PREDNISONE 20 MG PO TABS
60.0000 mg | ORAL_TABLET | Freq: Once | ORAL | Status: AC
  Administered 2018-09-22: 60 mg via ORAL
  Filled 2018-09-22: qty 3

## 2018-09-22 MED ORDER — IPRATROPIUM-ALBUTEROL 0.5-2.5 (3) MG/3ML IN SOLN
3.0000 mL | Freq: Once | RESPIRATORY_TRACT | Status: AC
  Administered 2018-09-22: 3 mL via RESPIRATORY_TRACT
  Filled 2018-09-22: qty 3

## 2018-09-22 MED ORDER — IPRATROPIUM-ALBUTEROL 0.5-2.5 (3) MG/3ML IN SOLN: 3.0000 mL | Freq: Once | RESPIRATORY_TRACT | Status: DC

## 2018-09-22 NOTE — ED Triage Notes (Signed)
Pt c/o cough, chest congestion and SOB x3 days, unrelieved by OTC medication. Pt denies chest pain.

## 2018-09-22 NOTE — ED Provider Notes (Addendum)
[facility] Emergency Department Provider Note   ____________________________________________   First MD Initiated Contact with Patient 09/22/18 1929     (approximate)  I have reviewed the triage vital signs and the nursing notes.   HISTORY  Chief Complaint Cough and Shortness of Breath    HPI Mark [name] is a 47 y.o. male patient complains of coughing up some yellow phlegm for about 3 days.  He does not have a fever.  He does smoke.  He has a history of asthma.  He is complaining of feeling a little bit short of breath but he is not in respiratory distress.  Nothing seems to make it better or worse.   Past Medical History:  Diagnosis Date  . Asthma     Patient Active Problem List   Diagnosis Date Noted  . Anaphylaxis 12/30/2017    History reviewed. No pertinent surgical history.  Prior to Admission medications   Medication Sig Start Date End Date Taking? Authorizing Provider  albuterol (PROVENTIL HFA;VENTOLIN HFA) 108 (90 Base) MCG/ACT inhaler Inhale 1-2 puffs into the lungs every 6 (six) hours as needed for wheezing or shortness of breath. 12/07/15   Hagler, Jami L, PA-C  albuterol (PROVENTIL HFA;VENTOLIN HFA) 108 (90 Base) MCG/ACT inhaler Inhale 2 puffs into the lungs every 6 (six) hours as needed for wheezing or shortness of breath. 09/22/18   Arnaldo NatalMalinda,  F, MD  azithromycin (ZITHROMAX Z-PAK) 250 MG tablet Take 2 tablets (500 mg) on  Day 1,  followed by 1 tablet (250 mg) once daily on Days 2 through 5. 09/22/18 09/27/18  Arnaldo NatalMalinda,  F, MD  nicotine (NICODERM CQ - DOSED IN MG/24 HOURS) 14 mg/24hr patch Place 1 patch (14 mg total) onto the skin daily. 01/01/18   Adrian SaranMody, Sital, MD  predniSONE (DELTASONE) 10 MG tablet Take 1 tablet (10 mg total) by mouth daily. 09/22/18   Arnaldo NatalMalinda,  F, MD    Allergies Contrast media [iodinated diagnostic agents] and Peanuts [peanut oil]  History reviewed. No pertinent family history.  Social  History Social History   Tobacco Use  . Smoking status: Current Every Day Smoker  . Smokeless tobacco: Never Used  Substance Use Topics  . Alcohol use: Yes  . Drug use: Not on file    Review of Systems  Constitutional: No fever/chills Eyes: No visual changes. ENT: No sore throat. Cardiovascular: Denies chest pain. Respiratory: shortness of breath. Gastrointestinal: No abdominal pain.  No nausea, no vomiting.  No diarrhea.  No constipation. Genitourinary: Negative for dysuria. Musculoskeletal: Negative for back pain. Skin: Negative for rash. Neurological: Negative for headaches, focal weakness  ____________________________________________   PHYSICAL EXAM:  VITAL SIGNS: ED Triage Vitals [09/22/18 1909]  Enc Vitals Group     BP (!) 169/89     Pulse Rate 71     Resp 18     Temp 98.4 F (36.9 C)     Temp Source Oral     SpO2 96 %     Weight      Height      Head Circumference      Peak Flow      Pain Score      Pain Loc      Pain Edu?      Excl. in GC?     Constitutional: Alert and oriented. Well appearing and in no acute distress. Eyes: Conjunctivae are normal.  Head: Atraumatic. Nose: No congestion/rhinnorhea. Mouth/Throat: Mucous membranes are moist.  Oropharynx non-erythematous. Neck: No  stridor. Cardiovascular: Normal rate, regular rhythm. Grossly normal heart sounds.  Good peripheral circulation. Respiratory: Normal respiratory effort.  No retractions. Lungs CTAB. Gastrointestinal: Soft and nontender. No distention. No abdominal bruits. No CVA tenderness. Musculoskeletal: No lower extremity tenderness nor edema.   Neurologic:  Normal speech and language. No gross focal neurologic deficits are appreciated Skin:  Skin is warm, dry and intact. No rash noted. Psychiatric: Mood and affect are normal. Speech and behavior are normal.  ____________________________________________   LABS (all labs ordered are listed, but only abnormal results are  displayed)  Labs Reviewed  BASIC METABOLIC PANEL - Abnormal; Notable for the following components:      Result Value   Glucose, Bld 117 (*)    All other components within normal limits  CBC  TROPONIN I   ____________________________________________  EKG  EKG read and interpreted by me shows normal sinus rhythm rate of 79 left axis nonspecific ST-T wave changes  ____________________________________________  RADIOLOGY  ED MD interpretation: X-ray read by radiology reviewed by me looks mostly like bronchitis.  Official radiology report(s): Dg Chest 2 View  Result Date: 09/22/2018 CLINICAL DATA:  Cough.  Congestion. EXAM: CHEST - 2 VIEW COMPARISON:  December 07, 2015 FINDINGS: No pneumothorax. The heart, hila, mediastinum are normal. No pulmonary nodules or masses. Coarsened lung markings without focal infiltrate, nodule, or mass. IMPRESSION: Coarsened lung markings consistent with bronchitic change. No focal infiltrate. Electronically Signed   By: Gerome Sam III M.D   On: 09/22/2018 19:40    ____________________________________________   PROCEDURES  Procedure(s) performed:   Procedures  Critical Care performed:  ____________________________________________   INITIAL IMPRESSION / ASSESSMENT AND PLAN / ED COURSE  Patient sats are good he does not get short of breath walking.  We will give him some albuterol and Zithromax to go home.         ____________________________________________   FINAL CLINICAL IMPRESSION(S) / ED DIAGNOSES  Final diagnoses:  Bronchitis     ED Discharge Orders         Ordered    azithromycin (ZITHROMAX Z-PAK) 250 MG tablet     09/22/18 2211    predniSONE (DELTASONE) 10 MG tablet  Daily     09/22/18 2211    albuterol (PROVENTIL HFA;VENTOLIN HFA) 108 (90 Base) MCG/ACT inhaler  Every 6 hours PRN     09/22/18 2211           Note:  This document was prepared using Dragon voice recognition software and may include  unintentional dictation errors.    Arnaldo Natal, MD 09/22/18 2255    Arnaldo Natal, MD 10/02/18 2351

## 2018-09-22 NOTE — ED Notes (Addendum)
Pt to room 2 via w/c from xray with no distress noted; Pt reports prod cough yellow sputum x 3 days; denies sinus congestion or other accomp symptoms; +smoker, hx asthma; resp even/unlab, lungs clear, apical audible & regular

## 2018-09-22 NOTE — Discharge Instructions (Addendum)
Please return if you are worse.  This includes more shortness of breath, fever feeling sicker, etc.  Take the Zithromax as directed that will be 2 pills tomorrow and 1 pill every day after that til gone.  Take the prednisone 1 a day.  Use the albuterol inhaler 2 puffs 4 times a day as needed for coughing or shortness of breath.  You can stop the albuterol inhaler once you feel better.

## 2018-12-08 ENCOUNTER — Other Ambulatory Visit: Payer: Self-pay

## 2018-12-08 ENCOUNTER — Emergency Department: Payer: Self-pay

## 2018-12-08 ENCOUNTER — Emergency Department
Admission: EM | Admit: 2018-12-08 | Discharge: 2018-12-08 | Disposition: A | Discharge status: Home / Self Care | Payer: Self-pay | Attending: Emergency Medicine | Admitting: Emergency Medicine

## 2018-12-08 ENCOUNTER — Encounter: Payer: Self-pay | Admitting: Emergency Medicine

## 2018-12-08 DIAGNOSIS — J069 Acute upper respiratory infection, unspecified: Secondary | ICD-10-CM | POA: Insufficient documentation

## 2018-12-08 DIAGNOSIS — J029 Acute pharyngitis, unspecified: Secondary | ICD-10-CM

## 2018-12-08 DIAGNOSIS — J45909 Unspecified asthma, uncomplicated: Secondary | ICD-10-CM | POA: Insufficient documentation

## 2018-12-08 DIAGNOSIS — Z79899 Other long term (current) drug therapy: Secondary | ICD-10-CM | POA: Insufficient documentation

## 2018-12-08 DIAGNOSIS — Z91010 Allergy to peanuts: Secondary | ICD-10-CM | POA: Insufficient documentation

## 2018-12-08 DIAGNOSIS — F17210 Nicotine dependence, cigarettes, uncomplicated: Secondary | ICD-10-CM | POA: Insufficient documentation

## 2018-12-08 HISTORY — DX: Epistaxis: R04.0

## 2018-12-08 LAB — CBC
HEMATOCRIT: 46.8 % (ref 39.0–52.0)
Hemoglobin: 15.9 g/dL (ref 13.0–17.0)
MCH: 31.8 pg (ref 26.0–34.0)
MCHC: 34 g/dL (ref 30.0–36.0)
MCV: 93.6 fL (ref 80.0–100.0)
Platelets: 221 10*3/uL (ref 150–400)
RBC: 5 MIL/uL (ref 4.22–5.81)
RDW: 12.8 % (ref 11.5–15.5)
WBC: 14.9 10*3/uL — AB (ref 4.0–10.5)
nRBC: 0 % (ref 0.0–0.2)

## 2018-12-08 LAB — BASIC METABOLIC PANEL
Anion gap: 9 (ref 5–15)
BUN: 12 mg/dL (ref 6–20)
CHLORIDE: 99 mmol/L (ref 98–111)
CO2: 27 mmol/L (ref 22–32)
Calcium: 9.7 mg/dL (ref 8.9–10.3)
Creatinine, Ser: 1.03 mg/dL (ref 0.61–1.24)
GFR calc Af Amer: >60 mL/min (ref >60)
GFR calc non Af Amer: >60 mL/min (ref >60)
Glucose, Bld: 110 mg/dL — ABNORMAL HIGH (ref 70–99)
POTASSIUM: 3.6 mmol/L (ref 3.5–5.1)
Sodium: 135 mmol/L (ref 135–145)

## 2018-12-08 LAB — GROUP A STREP BY PCR: GROUP A STREP BY PCR: NOT DETECTED

## 2018-12-08 LAB — TROPONIN I: Troponin I: <0.03 ng/mL (ref <0.03)

## 2018-12-08 MED ORDER — AZITHROMYCIN 250 MG PO TABS: ORAL_TABLET | ORAL | 0 refills | Status: AC

## 2018-12-08 MED ORDER — IPRATROPIUM-ALBUTEROL 0.5-2.5 (3) MG/3ML IN SOLN
3.0000 mL | Freq: Once | RESPIRATORY_TRACT | Status: AC
  Administered 2018-12-08: 3 mL via RESPIRATORY_TRACT
  Filled 2018-12-08: qty 3

## 2018-12-08 MED ORDER — DEXAMETHASONE SODIUM PHOSPHATE 10 MG/ML IJ SOLN
10.0000 mg | Freq: Once | INTRAMUSCULAR | Status: AC
  Administered 2018-12-08: 10 mg via INTRAMUSCULAR
  Filled 2018-12-08: qty 1

## 2018-12-08 NOTE — ED Triage Notes (Addendum)
Sore throat since Saturday and fever.  Also says he had chest pain and thought he was having a heart attack on Friday.  Only hurts when he coughs.

## 2018-12-08 NOTE — ED Notes (Signed)

## 2018-12-08 NOTE — ED Provider Notes (Addendum)
[facility] Emergency Department Provider Note   ____________________________________________    I have reviewed the triage vital signs and the nursing notes.   HISTORY  Chief Complaint Sore Throat; Fever; and Chest Pain     HPI Mark [name] is a 48 y.o. male who presents with complaints of sore throat, cough, fever, chest tightness over the last 2 to 3 days.  He is tolerating his secretions but describes burning pain with swallowing.  Also complains of cough that is at times painful and productive.  Positive fevers, subjective.  No recent travel.  Has not taken anything for this.  Some mild shortness of breath  Past Medical History:  Diagnosis Date  . Asthma   . Bleeding nose     Patient Active Problem List   Diagnosis Date Noted  . Anaphylaxis 12/30/2017    Past Surgical History:  Procedure Laterality Date  . HERNIA REPAIR      Prior to Admission medications   Medication Sig Start Date End Date Taking? Authorizing Provider  albuterol (PROVENTIL HFA;VENTOLIN HFA) 108 (90 Base) MCG/ACT inhaler Inhale 1-2 puffs into the lungs every 6 (six) hours as needed for wheezing or shortness of breath. 12/07/15   Hagler, Jami L, PA-C  albuterol (PROVENTIL HFA;VENTOLIN HFA) 108 (90 Base) MCG/ACT inhaler Inhale 2 puffs into the lungs every 6 (six) hours as needed for wheezing or shortness of breath. 09/22/18   Arnaldo NatalMalinda, Paul F, MD  azithromycin (ZITHROMAX Z-PAK) 250 MG tablet Take 2 tablets (500 mg) on  Day 1,  followed by 1 tablet (250 mg) once daily on Days 2 through 5. 12/08/18 12/13/18  Jene EveryKinner, Binnie Vonderhaar, MD  nicotine (NICODERM CQ - DOSED IN MG/24 HOURS) 14 mg/24hr patch Place 1 patch (14 mg total) onto the skin daily. 01/01/18   Adrian SaranMody, Sital, MD  predniSONE (DELTASONE) 10 MG tablet Take 1 tablet (10 mg total) by mouth daily. 09/22/18   Arnaldo NatalMalinda, Paul F, MD     Allergies Contrast media [iodinated diagnostic agents] and Peanuts [peanut oil]  No family history  on file.  Social History Social History   Tobacco Use  . Smoking status: Current Every Day Smoker  . Smokeless tobacco: Never Used  Substance Use Topics  . Alcohol use: Yes  . Drug use: Not on file    Review of Systems  Constitutional: As above Eyes: No visual changes.  ENT: Sore throat Cardiovascular: Denies chest pain. Respiratory: Wheezing Gastrointestinal: No abdominal pain.  No nausea, no vomiting.   Genitourinary: Negative for dysuria. Musculoskeletal: Myalgias Skin: Negative for rash. Neurological: Negative for headaches    ____________________________________________   PHYSICAL EXAM:  VITAL SIGNS: ED Triage Vitals  Enc Vitals Group     BP 12/08/18 1239 130/75     Pulse Rate 12/08/18 1239 81     Resp 12/08/18 1239 17     Temp 12/08/18 1239 98.7 F (37.1 C)     Temp Source 12/08/18 1239 Oral     SpO2 12/08/18 1239 96 %     Weight 12/08/18 1225 79.4 kg (175 lb)     Height 12/08/18 1225 1.753 m (5\' 9" )     Head Circumference --      Peak Flow --      Pain Score 12/08/18 1404 10     Pain Loc --      Pain Edu? --      Excl. in GC? --     Constitutional: Alert and oriented. Eyes: Conjunctivae are  normal.   Nose positive congestion Mouth/Throat: Mucous membranes are moist.  Mild pharyngeal erythema, no significant swelling  neck:  Painless ROM, positive lymphadenopathy Cardiovascular: Normal rate, regular rhythm. Grossly normal heart sounds.  Good peripheral circulation. Respiratory: Normal respiratory effort.  No retractions.  Scattered mild wheezes Gastrointestinal: Soft and nontender. No distention.    Musculoskeletal:   Warm and well perfused Neurologic:  Normal speech and language. No gross focal neurologic deficits are appreciated.  Skin:  Skin is warm, dry and intact. No rash noted. Psychiatric: Mood and affect are normal. Speech and behavior are normal.  ____________________________________________   LABS (all labs ordered are listed, but  only abnormal results are displayed)  Labs Reviewed  BASIC METABOLIC PANEL - Abnormal; Notable for the following components:      Result Value   Glucose, Bld 110 (*)    All other components within normal limits  CBC - Abnormal; Notable for the following components:   WBC 14.9 (*)    All other components within normal limits  GROUP A STREP BY PCR  TROPONIN I   ____________________________________________  EKG  ED ECG REPORT I, Jene Everyobert Kathrynne Kulinski, the attending physician, personally viewed and interpreted this ECG.  Date: 12/21/2018  Rhythm: normal sinus rhythm QRS Axis: normal Intervals: normal ST/T Wave abnormalities: normal Narrative Interpretation: no evidence of acute ischemia  ____________________________________________  RADIOLOGY  Chest x-ray unremarkable ____________________________________________   PROCEDURES  Procedure(s) performed: No  Procedures   Critical Care performed: No ____________________________________________   INITIAL IMPRESSION / ASSESSMENT AND PLAN / ED COURSE  Pertinent labs & imaging results that were available during my care of the patient were reviewed by me and considered in my medical decision making (see chart for details).  Patient overall well-appearing and in no acute distress.  Suspect viral upper respiratory infection given pharyngitis strep swab sent which was negative.  Lab work significant for mild elevation white blood cell count, nonspecific.  Chest x-ray negative for pneumonia.  Treated with steroid injection, DuoNeb with significant improvement.  Strict return precautions discussed    ____________________________________________   FINAL CLINICAL IMPRESSION(S) / ED DIAGNOSES  Final diagnoses:  Upper respiratory tract infection, unspecified type  Pharyngitis, unspecified etiology        Note:  This document was prepared using Dragon voice recognition software and may include unintentional dictation errors.     Jene EveryKinner, Felica Chargois, MD 12/08/18 [phone]    Jene EveryKinner, Cecylia Brazill, MD 12/21/18 [phone]

## 2020-07-15 ENCOUNTER — Emergency Department
Admission: EM | Admit: 2020-07-15 | Discharge: 2020-07-15 | Disposition: A | Discharge status: Home / Self Care | Payer: Self-pay | Attending: Emergency Medicine | Admitting: Emergency Medicine

## 2020-07-15 ENCOUNTER — Encounter: Payer: Self-pay | Admitting: Intensive Care

## 2020-07-15 ENCOUNTER — Other Ambulatory Visit: Payer: Self-pay

## 2020-07-15 DIAGNOSIS — Y93F2 Activity, caregiving, lifting: Secondary | ICD-10-CM | POA: Insufficient documentation

## 2020-07-15 DIAGNOSIS — Y929 Unspecified place or not applicable: Secondary | ICD-10-CM | POA: Insufficient documentation

## 2020-07-15 DIAGNOSIS — X500XXA Overexertion from strenuous movement or load, initial encounter: Secondary | ICD-10-CM | POA: Insufficient documentation

## 2020-07-15 DIAGNOSIS — Y990 Civilian activity done for income or pay: Secondary | ICD-10-CM | POA: Insufficient documentation

## 2020-07-15 DIAGNOSIS — M79601 Pain in right arm: Secondary | ICD-10-CM | POA: Insufficient documentation

## 2020-07-15 DIAGNOSIS — J45909 Unspecified asthma, uncomplicated: Secondary | ICD-10-CM | POA: Insufficient documentation

## 2020-07-15 DIAGNOSIS — Z91010 Allergy to peanuts: Secondary | ICD-10-CM | POA: Insufficient documentation

## 2020-07-15 DIAGNOSIS — Z7951 Long term (current) use of inhaled steroids: Secondary | ICD-10-CM | POA: Insufficient documentation

## 2020-07-15 DIAGNOSIS — Z79899 Other long term (current) drug therapy: Secondary | ICD-10-CM | POA: Insufficient documentation

## 2020-07-15 MED ORDER — LIDOCAINE 5 % EX PTCH
1.0000 | MEDICATED_PATCH | CUTANEOUS | Status: DC
  Administered 2020-07-15: 1 via TRANSDERMAL
  Filled 2020-07-15: qty 1

## 2020-07-15 MED ORDER — IBUPROFEN 600 MG PO TABS: 600.0000 mg | ORAL_TABLET | Freq: Three times a day (TID) | ORAL | 0 refills | Status: AC | PRN

## 2020-07-15 MED ORDER — CYCLOBENZAPRINE HCL 10 MG PO TABS: 10.0000 mg | ORAL_TABLET | Freq: Three times a day (TID) | ORAL | 0 refills | Status: DC | PRN

## 2020-07-15 NOTE — Discharge Instructions (Signed)
Wear arm sling for 3 to 5 days.  Follow discharge care instruction take medication as directed.

## 2020-07-15 NOTE — ED Triage Notes (Signed)
Patient presents with right bicep pain X1 month. Denies injury

## 2020-07-15 NOTE — ED Notes (Signed)
Pt states upper right arm pain for over 3 weeks. Pt denies pain in lower right arm. Pt states he does repetitive motions at work and is right handed.

## 2020-07-15 NOTE — ED Provider Notes (Signed)
[facility] Emergency Department Provider Note   ____________________________________________   First MD Initiated Contact with Patient 07/15/20 [phone]     (approximate)  I have reviewed the triage vital signs and the nursing notes.   HISTORY  Chief Complaint Arm Pain    HPI Mark [name] is a 49 y.o. male patient complain of right bicep pain for 1 month.  Patient denies specific provocative incident.  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  Patient denies loss sensation loss of motion.  Patient describes the pain is "achy".  Rates pain as a 9/10.  Patient states keeping the arm in a flexed position decreases the pain.      Past Medical History:  Diagnosis Date  . Asthma   . Bleeding nose     Patient Active Problem List   Diagnosis Date Noted  . Anaphylaxis 12/30/2017    Past Surgical History:  Procedure Laterality Date  . HERNIA REPAIR      Prior to Admission medications   Medication Sig Start Date End Date Taking? Authorizing Provider  albuterol (PROVENTIL HFA;VENTOLIN HFA) 108 (90 Base) MCG/ACT inhaler Inhale 1-2 puffs into the lungs every 6 (six) hours as needed for wheezing or shortness of breath. 12/07/15   Hagler, Jami L, PA-C  albuterol (PROVENTIL HFA;VENTOLIN HFA) 108 (90 Base) MCG/ACT inhaler Inhale 2 puffs into the lungs every 6 (six) hours as needed for wheezing or shortness of breath. 09/22/18   Arnaldo Natal, MD  cyclobenzaprine (FLEXERIL) 10 MG tablet Take 1 tablet (10 mg total) by mouth 3 (three) times daily as needed. 07/15/20   Joni Reining, PA-C  ibuprofen (ADVIL) 600 MG tablet Take 1 tablet (600 mg total) by mouth every 8 (eight) hours as needed. 07/15/20   Joni Reining, PA-C  nicotine (NICODERM CQ - DOSED IN MG/24 HOURS) 14 mg/24hr patch Place 1 patch (14 mg total) onto the skin daily. 01/01/18   Adrian Saran, MD  predniSONE (DELTASONE) 10 MG tablet Take 1 tablet (10 mg total) by mouth daily. 09/22/18    Arnaldo Natal, MD    Allergies Contrast media [iodinated diagnostic agents] and Peanuts [peanut oil]  History reviewed. No pertinent family history.  Social History Social History   Tobacco Use  . Smoking status: Current Every Day Smoker    Types: Cigarettes  . Smokeless tobacco: Never Used  Substance Use Topics  . Alcohol use: Yes  . Drug use: Never    Review of Systems Constitutional: No fever/chills Eyes: No visual changes. ENT: No sore throat. Cardiovascular: Denies chest pain. Respiratory: Denies shortness of breath. Gastrointestinal: No abdominal pain.  No nausea, no vomiting.  No diarrhea.  No constipation. Genitourinary: Negative for dysuria. Musculoskeletal: Right upper arm pain. Skin: Negative for rash. Neurological: Negative for headaches, focal weakness or numbness. Allergic/Immunilogical: IVP dye and peanuts.  ____________________________________________   PHYSICAL EXAM:  VITAL SIGNS: ED Triage Vitals [07/15/20 0815]  Enc Vitals Group     BP 118/68     Pulse Rate 89     Resp 14     Temp 98.3 F (36.8 C)     Temp Source Oral     SpO2 96 %     Weight 185 lb (83.9 kg)     Height 5\' 8"  (1.727 m)     Head Circumference      Peak Flow      Pain Score 9     Pain Loc  Pain Edu?      Excl. in GC?     Constitutional: Alert and oriented. Well appearing and in no acute distress. Cardiovascular: Normal rate, regular rhythm. Grossly normal heart sounds.  Good peripheral circulation. Respiratory: Normal respiratory effort.  No retractions. Lungs CTAB. Musculoskeletal: No obvious deformity to the right upper arm.  Patient is full neck range of motion.  Patient has moderate guarding palpation superior aspect of the bicep.Marland Kitchen Neurologic:  Normal speech and language. No gross focal neurologic deficits are appreciated. No gait instability. Skin:  Skin is warm, dry and intact. No rash noted. Psychiatric: Mood and affect are normal. Speech and behavior are  normal.  ____________________________________________   LABS (all labs ordered are listed, but only abnormal results are displayed)  Labs Reviewed - No data to display ____________________________________________  EKG   ____________________________________________  RADIOLOGY  ED MD interpretation:    Official radiology report(s): No results found.  ____________________________________________   PROCEDURES  Procedure(s) performed (including Critical Care):  Procedures   ____________________________________________   INITIAL IMPRESSION / ASSESSMENT AND PLAN / ED COURSE  As part of my medical decision making, I reviewed the following data within the electronic MEDICAL RECORD NUMBER     Patient presents with approximately 1 month of right upper arm pain.  Patient complaint physical exam consistent with muscle strain.  Patient given discharge care instruction.  Patient placed in arm sling and given a prescription for Flexeril and ibuprofen.  Patient vies follow-up PCP if no improvement in 1 week if condition worsens.    Mark [name] was evaluated in Emergency Department on 07/15/2020 for the symptoms described in the history of present illness. He was evaluated in the context of the global COVID-19 pandemic, which necessitated consideration that the patient might be at risk for infection with the SARS-CoV-2 virus that causes COVID-19. Institutional protocols and algorithms that pertain to the evaluation of patients at risk for COVID-19 are in a state of rapid change based on information released by regulatory bodies including the CDC and federal and state organizations. These policies and algorithms were followed during the patient's care in the ED.       ____________________________________________   FINAL CLINICAL IMPRESSION(S) / ED DIAGNOSES  Final diagnoses:  Right arm pain     ED Discharge Orders         Ordered    cyclobenzaprine (FLEXERIL) 10 MG tablet  3 times  daily PRN        07/15/20 0921    ibuprofen (ADVIL) 600 MG tablet  Every 8 hours PRN        07/15/20 0350           Note:  This document was prepared using Dragon voice recognition software and may include unintentional dictation errors.    Jamaal, Bernasconi, PA-C 07/15/20 0938    Chesley Noon, MD 07/15/20 1538

## 2020-08-02 ENCOUNTER — Encounter: Payer: Self-pay | Admitting: *Deleted

## 2020-08-02 ENCOUNTER — Emergency Department: Payer: Self-pay

## 2020-08-02 ENCOUNTER — Emergency Department
Admission: EM | Admit: 2020-08-02 | Discharge: 2020-08-02 | Disposition: A | Discharge status: Home / Self Care | Payer: Self-pay | Attending: Emergency Medicine | Admitting: Emergency Medicine

## 2020-08-02 ENCOUNTER — Other Ambulatory Visit: Payer: Self-pay

## 2020-08-02 DIAGNOSIS — F17210 Nicotine dependence, cigarettes, uncomplicated: Secondary | ICD-10-CM | POA: Insufficient documentation

## 2020-08-02 DIAGNOSIS — R531 Weakness: Secondary | ICD-10-CM | POA: Insufficient documentation

## 2020-08-02 DIAGNOSIS — Z79899 Other long term (current) drug therapy: Secondary | ICD-10-CM | POA: Insufficient documentation

## 2020-08-02 DIAGNOSIS — Z20822 Contact with and (suspected) exposure to covid-19: Secondary | ICD-10-CM | POA: Insufficient documentation

## 2020-08-02 DIAGNOSIS — Z91010 Allergy to peanuts: Secondary | ICD-10-CM | POA: Insufficient documentation

## 2020-08-02 DIAGNOSIS — J45909 Unspecified asthma, uncomplicated: Secondary | ICD-10-CM | POA: Insufficient documentation

## 2020-08-02 DIAGNOSIS — J039 Acute tonsillitis, unspecified: Secondary | ICD-10-CM | POA: Insufficient documentation

## 2020-08-02 LAB — CBC WITH DIFFERENTIAL/PLATELET
Abs Immature Granulocytes: 0.08 10*3/uL — ABNORMAL HIGH (ref 0.00–0.07)
Basophils Absolute: 0.1 10*3/uL (ref 0.0–0.1)
Basophils Relative: 0 %
Eosinophils Absolute: 0.1 10*3/uL (ref 0.0–0.5)
Eosinophils Relative: 1 %
HCT: 42.7 % (ref 39.0–52.0)
Hemoglobin: 14.6 g/dL (ref 13.0–17.0)
Immature Granulocytes: 1 %
Lymphocytes Relative: 20 %
Lymphs Abs: 2.4 10*3/uL (ref 0.7–4.0)
MCH: 31.5 pg (ref 26.0–34.0)
MCHC: 34.2 g/dL (ref 30.0–36.0)
MCV: 92.2 fL (ref 80.0–100.0)
Monocytes Absolute: 1.2 10*3/uL — ABNORMAL HIGH (ref 0.1–1.0)
Monocytes Relative: 10 %
Neutro Abs: 8 10*3/uL — ABNORMAL HIGH (ref 1.7–7.7)
Neutrophils Relative %: 68 %
Platelets: 223 10*3/uL (ref 150–400)
RBC: 4.63 MIL/uL (ref 4.22–5.81)
RDW: 12.8 % (ref 11.5–15.5)
WBC: 11.8 10*3/uL — ABNORMAL HIGH (ref 4.0–10.5)
nRBC: 0 % (ref 0.0–0.2)

## 2020-08-02 LAB — COMPREHENSIVE METABOLIC PANEL
ALT: 24 U/L (ref 0–44)
AST: 24 U/L (ref 15–41)
Albumin: 4.2 g/dL (ref 3.5–5.0)
Alkaline Phosphatase: 66 U/L (ref 38–126)
Anion gap: 9 (ref 5–15)
BUN: 15 mg/dL (ref 6–20)
CO2: 26 mmol/L (ref 22–32)
Calcium: 9 mg/dL (ref 8.9–10.3)
Chloride: 101 mmol/L (ref 98–111)
Creatinine, Ser: 0.83 mg/dL (ref 0.61–1.24)
GFR calc Af Amer: >60 mL/min (ref >60)
GFR calc non Af Amer: >60 mL/min (ref >60)
Glucose, Bld: 98 mg/dL (ref 70–99)
Potassium: 3.8 mmol/L (ref 3.5–5.1)
Sodium: 136 mmol/L (ref 135–145)
Total Bilirubin: 0.8 mg/dL (ref 0.3–1.2)
Total Protein: 8.2 g/dL — ABNORMAL HIGH (ref 6.5–8.1)

## 2020-08-02 LAB — SARS CORONAVIRUS 2 BY RT PCR (HOSPITAL ORDER, PERFORMED IN ~~LOC~~ HOSPITAL LAB): SARS Coronavirus 2: NEGATIVE

## 2020-08-02 LAB — LACTIC ACID, PLASMA: Lactic Acid, Venous: 1 mmol/L (ref 0.5–1.9)

## 2020-08-02 LAB — GROUP A STREP BY PCR: Group A Strep by PCR: NOT DETECTED

## 2020-08-02 MED ORDER — DEXAMETHASONE SODIUM PHOSPHATE 10 MG/ML IJ SOLN
10.0000 mg | Freq: Once | INTRAMUSCULAR | Status: AC
  Administered 2020-08-02: 10 mg via INTRAVENOUS
  Filled 2020-08-02: qty 1

## 2020-08-02 MED ORDER — PREDNISONE 50 MG PO TABS: 50.0000 mg | ORAL_TABLET | Freq: Every day | ORAL | 0 refills | Status: DC

## 2020-08-02 MED ORDER — AMOXICILLIN 875 MG PO TABS: 875.0000 mg | ORAL_TABLET | Freq: Two times a day (BID) | ORAL | 0 refills | Status: DC

## 2020-08-02 MED ORDER — SODIUM CHLORIDE 0.9 % IV BOLUS
1000.0000 mL | Freq: Once | INTRAVENOUS | Status: AC
  Administered 2020-08-02: 1000 mL via INTRAVENOUS

## 2020-08-02 MED ORDER — MAGIC MOUTHWASH W/LIDOCAINE: 5.0000 mL | Freq: Four times a day (QID) | ORAL | 0 refills | Status: DC

## 2020-08-02 MED ORDER — SODIUM CHLORIDE 0.9 % IV SOLN
3.0000 g | Freq: Once | INTRAVENOUS | Status: AC
  Administered 2020-08-02: 3 g via INTRAVENOUS
  Filled 2020-08-02: qty 8

## 2020-08-02 NOTE — ED Provider Notes (Signed)
[facility] Emergency Department Provider Note  ____________________________________________  Time seen: Approximately 4:43 PM  I have reviewed the triage vital signs and the nursing notes.   HISTORY  Chief Complaint Sore Throat    HPI Mark [name] is a 49 y.o. male who presents the emergency department complaining of sore throat, voice changes, fevers, body aches.  Patient states that he developed fevers, weakness 3 days ago.  Patient states that later on that evening he developed a sore throat that has been progressively worse.  He states that sore throat is on both sides, he does have difficulty swallowing but no difficulty breathing.  Patient states that he has noticed that his voice is changed as well.  He states that it sounds "muffled."  Patient has had some mild nasal congestion but no headache, visual changes, cough, shortness of breath, chest pain.  No recent exposure to Covid according to the patient.  Patient is used Aleve for symptom relief prior to arrival.         Past Medical History:  Diagnosis Date  . Asthma   . Bleeding nose     Patient Active Problem List   Diagnosis Date Noted  . Anaphylaxis 12/30/2017    Past Surgical History:  Procedure Laterality Date  . HERNIA REPAIR      Prior to Admission medications   Medication Sig Start Date End Date Taking? Authorizing Provider  albuterol (PROVENTIL HFA;VENTOLIN HFA) 108 (90 Base) MCG/ACT inhaler Inhale 1-2 puffs into the lungs every 6 (six) hours as needed for wheezing or shortness of breath. 12/07/15   Hagler, Jami L, PA-C  albuterol (PROVENTIL HFA;VENTOLIN HFA) 108 (90 Base) MCG/ACT inhaler Inhale 2 puffs into the lungs every 6 (six) hours as needed for wheezing or shortness of breath. 09/22/18   Arnaldo Natal, MD  cyclobenzaprine (FLEXERIL) 10 MG tablet Take 1 tablet (10 mg total) by mouth 3 (three) times daily as needed. 07/15/20   Joni Reining, PA-C  ibuprofen (ADVIL) 600 MG  tablet Take 1 tablet (600 mg total) by mouth every 8 (eight) hours as needed. 07/15/20   Joni Reining, PA-C  nicotine (NICODERM CQ - DOSED IN MG/24 HOURS) 14 mg/24hr patch Place 1 patch (14 mg total) onto the skin daily. 01/01/18   Adrian Saran, MD  predniSONE (DELTASONE) 10 MG tablet Take 1 tablet (10 mg total) by mouth daily. 09/22/18   Arnaldo Natal, MD    Allergies Contrast media [iodinated diagnostic agents] and Peanuts [peanut oil]  No family history on file.  Social History Social History   Tobacco Use  . Smoking status: Current Every Day Smoker    Types: Cigarettes  . Smokeless tobacco: Never Used  Substance Use Topics  . Alcohol use: Yes  . Drug use: Never     Review of Systems  Constitutional: Positive fever/chills Eyes: No visual changes. No discharge ENT: Positive for sore throat with voice changes.  Difficulty swallowing. Cardiovascular: no chest pain. Respiratory: no cough. No SOB. Gastrointestinal: No abdominal pain.  No nausea, no vomiting.  No diarrhea.  No constipation. Musculoskeletal: Negative for musculoskeletal pain. Skin: Negative for rash, abrasions, lacerations, ecchymosis. Neurological: Negative for headaches, focal weakness or numbness. 10-point ROS otherwise negative.  ____________________________________________   PHYSICAL EXAM:  VITAL SIGNS: ED Triage Vitals  Enc Vitals Group     BP 08/02/20 1610 130/80     Pulse Rate 08/02/20 1610 73     Resp 08/02/20 1610 17  Temp 08/02/20 1610 98.6 F (37 C)     Temp Source 08/02/20 1610 Oral     SpO2 08/02/20 1610 97 %     Weight --      Height --      Head Circumference --      Peak Flow --      Pain Score 08/02/20 1615 9     Pain Loc --      Pain Edu? --      Excl. in GC? --      Constitutional: Alert and oriented. Well appearing and in no acute distress. Eyes: Conjunctivae are normal. PERRL. EOMI. Head: Atraumatic. ENT:      Ears: EACs and TMs unremarkable bilaterally.       Nose: No congestion/rhinnorhea.      Mouth/Throat: Mucous membranes are moist.  Oropharynx is grossly erythematous and edematous.  Significant tonsillar hypertrophy bilaterally.  Uvula deviation to the right.  Mallampati score of 4 Neck: No stridor.  No cervical spine tenderness to palpation.  Full range of motion to the cervical spine.  No erythema, tenderness to the anterior neck.  No erythema or edema of the submandibular region. Hematological/Lymphatic/Immunilogical: Scattered, tender, bilateral anterior cervical lymphadenopathy. Cardiovascular: Normal rate, regular rhythm. Normal S1 and S2.  Good peripheral circulation. Respiratory: Normal respiratory effort without tachypnea or retractions. Lungs CTAB. Good air entry to the bases with no decreased or absent breath sounds. Musculoskeletal: Full range of motion to all extremities. No gross deformities appreciated. Neurologic:  Normal speech and language. No gross focal neurologic deficits are appreciated.  Skin:  Skin is warm, dry and intact. No rash noted. Psychiatric: Mood and affect are normal. Speech and behavior are normal. Patient exhibits appropriate insight and judgement.   ____________________________________________   LABS (all labs ordered are listed, but only abnormal results are displayed)  Labs Reviewed  COMPREHENSIVE METABOLIC PANEL - Abnormal; Notable for the following components:      Result Value   Total Protein 8.2 (*)    All other components within normal limits  CBC WITH DIFFERENTIAL/PLATELET - Abnormal; Notable for the following components:   WBC 11.8 (*)    Neutro Abs 8.0 (*)    Monocytes Absolute 1.2 (*)    Abs Immature Granulocytes 0.08 (*)    All other components within normal limits  GROUP A STREP BY PCR  SARS CORONAVIRUS 2 BY RT PCR (HOSPITAL ORDER, PERFORMED IN [facility])  LACTIC ACID, PLASMA  LACTIC ACID, PLASMA    ____________________________________________  EKG   ____________________________________________  RADIOLOGY I personally viewed and evaluated these images as part of my medical decision making, as well as reviewing the written report by the radiologist.  CT Soft Tissue Neck Wo Contrast  Result Date: 08/02/2020 CLINICAL DATA:  Sore throat. Rule out infection. Difficulty swallowing. The patient has allergy to IV contrast with anaphylaxis. EXAM: CT NECK WITHOUT CONTRAST TECHNIQUE: Multidetector CT imaging of the neck was performed following the standard protocol without intravenous contrast. COMPARISON:  None. FINDINGS: Pharynx and larynx: Significant enlargement of the tonsils bilaterally. Lack of intravenous contrast limits evaluation for abscess however no peritonsillar fluid collection is identified. Epiglottis normal. Larynx normal Salivary glands: No inflammation, mass, or stone. Thyroid: Negative Lymph nodes: Multiple cervical lymph nodes are present bilaterally without pathologic enlargement. Right level 2 lymph node 10.7 mm. Additional subcentimeter level 5 lymph nodes on the right. Left level 2 lymph node 7 mm. Additional small subcentimeter posterior lymph nodes on the left. Vascular:  Limited vascular evaluation without intravenous contrast. Limited intracranial: Negative Visualized orbits: Negative Mastoids and visualized paranasal sinuses: Paranasal sinuses clear. Mastoid clear. Skeleton: Poor dentition with numerous caries and periapical dental lucencies. Mild degenerative change in the cervical spine. Upper chest: Lung apices clear bilaterally. Other: None IMPRESSION: 1. Significant enlargement of the tonsils bilaterally. Lack of intravenous contrast limits ability to detect abscess however no fluid collection is identified. 2. Reactive lymph nodes in the neck bilaterally. Electronically Signed   By: Marlan Palau M.D.   On: 08/02/2020 17:32     ____________________________________________    PROCEDURES  Procedure(s) performed:    Procedures    Medications  sodium chloride 0.9 % bolus 1,000 mL (0 mLs Intravenous Stopped 08/02/20 1909)  Ampicillin-Sulbactam (UNASYN) 3 g in sodium chloride 0.9 % 100 mL IVPB (0 g Intravenous Stopped 08/02/20 1909)  dexamethasone (DECADRON) injection 10 mg (10 mg Intravenous Given 08/02/20 1713)     ____________________________________________   INITIAL IMPRESSION / ASSESSMENT AND PLAN / ED COURSE  Pertinent labs & imaging results that were available during my care of the patient were reviewed by me and considered in my medical decision making (see chart for details).  Review of the [place] CSRS was performed in accordance of the [facility] prior to dispensing any controlled drugs.           Patient's diagnosis is consistent with tonsillitis. Patient presented to emergency department complaining of sore throat, voice changes x2 to 3 days. Patient has had progressive sore throat, states that his voice sounds are muffled. On exam, patient had significant bilateral tonsillar hypertrophy. Uvula seem to be deviated slightly to the right. Given the findings, patient was evaluated with labs, CT scan. Patient has anaphylaxis with IV contrast and as such had a CT scan of the neck without contrast. There does not appear to be any appreciable fluid collection in the tonsils consistent with peritonsillar abscess. As patient is still maintaining his own secretions, able to swallow, no airway difficulties felt patient stable for discharge. Patient received Unasyn and Decadron here in the emergency department. Patient will be prescribed amoxicillin, prednisone, Magic mouthwash for symptom relief. I have given patient strict return precautions to return for any difficulty breathing, inability to swallow, worsening pain unilaterally in the throat or neck, no improvement with medications. Patient verbalizes  understanding of same. Otherwise follow-up with primary care as needed.. Patient is given ED precautions to return to the ED for any worsening or new symptoms.     ____________________________________________  FINAL CLINICAL IMPRESSION(S) / ED DIAGNOSES  Final diagnoses:  Tonsillitis      NEW MEDICATIONS STARTED DURING THIS VISIT:  ED Discharge Orders    None          This chart was dictated using voice recognition software/Dragon. Despite best efforts to proofread, errors can occur which can change the meaning. Any change was purely unintentional.    Racheal Patches, PA-C 08/02/20 2048    Minna Antis, MD 08/02/20 [phone]

## 2020-08-02 NOTE — ED Notes (Signed)
See triage - pt with sore throat since Sunday - unable to visualize throat

## 2020-08-02 NOTE — ED Triage Notes (Signed)
Pt reports a sore throat and runny nose.  Sx for 3 days.  Pt has a cough.  Pt alert  Speech clear.

## 2020-08-02 NOTE — ED Notes (Signed)
Pt unable to sign E-signature due to signature pad malfunction. Pt verbalized understanding of d/c instructions and had no additional questions or concerns for this RN or provider. Pt left with d/c instructions and gathered all personal belongings from room and removed them prior to ED departure.   

## 2020-12-22 ENCOUNTER — Emergency Department
Admission: EM | Admit: 2020-12-22 | Discharge: 2020-12-23 | Disposition: A | Discharge status: Home / Self Care | Payer: Self-pay | Attending: Emergency Medicine | Admitting: Emergency Medicine

## 2020-12-22 ENCOUNTER — Other Ambulatory Visit: Payer: Self-pay

## 2020-12-22 DIAGNOSIS — Z5321 Procedure and treatment not carried out due to patient leaving prior to being seen by health care provider: Secondary | ICD-10-CM | POA: Insufficient documentation

## 2020-12-22 DIAGNOSIS — R1030 Lower abdominal pain, unspecified: Secondary | ICD-10-CM | POA: Insufficient documentation

## 2020-12-22 DIAGNOSIS — R509 Fever, unspecified: Secondary | ICD-10-CM | POA: Insufficient documentation

## 2020-12-22 LAB — URINALYSIS, COMPLETE (UACMP) WITH MICROSCOPIC
Bacteria, UA: NONE SEEN
Bilirubin Urine: NEGATIVE
Glucose, UA: NEGATIVE mg/dL
Hgb urine dipstick: NEGATIVE
Ketones, ur: NEGATIVE mg/dL
Leukocytes,Ua: NEGATIVE
Nitrite: NEGATIVE
Protein, ur: NEGATIVE mg/dL
Specific Gravity, Urine: 1.024 (ref 1.005–1.030)
Squamous Epithelial / HPF: NONE SEEN (ref 0–5)
pH: 5 (ref 5.0–8.0)

## 2020-12-22 LAB — COMPREHENSIVE METABOLIC PANEL
ALT: 23 U/L (ref 0–44)
AST: 25 U/L (ref 15–41)
Albumin: 4.8 g/dL (ref 3.5–5.0)
Alkaline Phosphatase: 57 U/L (ref 38–126)
Anion gap: 9 (ref 5–15)
BUN: 16 mg/dL (ref 6–20)
CO2: 27 mmol/L (ref 22–32)
Calcium: 8.9 mg/dL (ref 8.9–10.3)
Chloride: 99 mmol/L (ref 98–111)
Creatinine, Ser: 0.81 mg/dL (ref 0.61–1.24)
GFR, Estimated: >60 mL/min (ref >60)
Glucose, Bld: 106 mg/dL — ABNORMAL HIGH (ref 70–99)
Potassium: 3.4 mmol/L — ABNORMAL LOW (ref 3.5–5.1)
Sodium: 135 mmol/L (ref 135–145)
Total Bilirubin: 0.9 mg/dL (ref 0.3–1.2)
Total Protein: 8.1 g/dL (ref 6.5–8.1)

## 2020-12-22 LAB — CBC
HCT: 45.1 % (ref 39.0–52.0)
Hemoglobin: 15.6 g/dL (ref 13.0–17.0)
MCH: 31.6 pg (ref 26.0–34.0)
MCHC: 34.6 g/dL (ref 30.0–36.0)
MCV: 91.3 fL (ref 80.0–100.0)
Platelets: 202 10*3/uL (ref 150–400)
RBC: 4.94 MIL/uL (ref 4.22–5.81)
RDW: 12.7 % (ref 11.5–15.5)
WBC: 8 10*3/uL (ref 4.0–10.5)
nRBC: 0 % (ref 0.0–0.2)

## 2020-12-22 LAB — LIPASE, BLOOD: Lipase: 22 U/L (ref 11–51)

## 2020-12-22 NOTE — ED Triage Notes (Signed)
Patient reports lower abdominal pain all day and reports now running a fever (did not check).

## 2020-12-23 NOTE — ED Notes (Signed)
Called pt several times no answer  

## 2020-12-23 NOTE — ED Notes (Signed)
Called  Pt several times no answer

## 2021-02-23 ENCOUNTER — Emergency Department
Admission: EM | Admit: 2021-02-23 | Discharge: 2021-02-23 | Disposition: A | Discharge status: Other Acute Inpatient Hospital | Payer: Self-pay | Attending: Emergency Medicine | Admitting: Emergency Medicine

## 2021-02-23 ENCOUNTER — Other Ambulatory Visit: Payer: Self-pay

## 2021-02-23 ENCOUNTER — Encounter: Payer: Self-pay | Admitting: Emergency Medicine

## 2021-02-23 ENCOUNTER — Inpatient Hospital Stay
Admission: RE | Admit: 2021-02-23 | Discharge: 2021-02-26 | DRG: 885 | Disposition: A | Discharge status: Home / Self Care | Payer: 59 | Source: Intra-hospital | Attending: Behavioral Health | Admitting: Behavioral Health

## 2021-02-23 DIAGNOSIS — R45851 Suicidal ideations: Secondary | ICD-10-CM | POA: Diagnosis present

## 2021-02-23 DIAGNOSIS — R41843 Psychomotor deficit: Secondary | ICD-10-CM | POA: Diagnosis present

## 2021-02-23 DIAGNOSIS — Z720 Tobacco use: Secondary | ICD-10-CM | POA: Diagnosis present

## 2021-02-23 DIAGNOSIS — F332 Major depressive disorder, recurrent severe without psychotic features: Secondary | ICD-10-CM

## 2021-02-23 DIAGNOSIS — F329 Major depressive disorder, single episode, unspecified: Secondary | ICD-10-CM | POA: Insufficient documentation

## 2021-02-23 DIAGNOSIS — F419 Anxiety disorder, unspecified: Secondary | ICD-10-CM | POA: Diagnosis present

## 2021-02-23 DIAGNOSIS — F1010 Alcohol abuse, uncomplicated: Secondary | ICD-10-CM | POA: Diagnosis present

## 2021-02-23 DIAGNOSIS — F17210 Nicotine dependence, cigarettes, uncomplicated: Secondary | ICD-10-CM | POA: Diagnosis present

## 2021-02-23 DIAGNOSIS — F322 Major depressive disorder, single episode, severe without psychotic features: Secondary | ICD-10-CM | POA: Diagnosis present

## 2021-02-23 DIAGNOSIS — F1410 Cocaine abuse, uncomplicated: Secondary | ICD-10-CM | POA: Diagnosis present

## 2021-02-23 DIAGNOSIS — Z9151 Personal history of suicidal behavior: Secondary | ICD-10-CM

## 2021-02-23 DIAGNOSIS — Z79899 Other long term (current) drug therapy: Secondary | ICD-10-CM

## 2021-02-23 DIAGNOSIS — Z20822 Contact with and (suspected) exposure to covid-19: Secondary | ICD-10-CM | POA: Insufficient documentation

## 2021-02-23 DIAGNOSIS — Z7952 Long term (current) use of systemic steroids: Secondary | ICD-10-CM | POA: Diagnosis not present

## 2021-02-23 DIAGNOSIS — J449 Chronic obstructive pulmonary disease, unspecified: Secondary | ICD-10-CM | POA: Diagnosis present

## 2021-02-23 DIAGNOSIS — Z91010 Allergy to peanuts: Secondary | ICD-10-CM | POA: Insufficient documentation

## 2021-02-23 DIAGNOSIS — M79606 Pain in leg, unspecified: Secondary | ICD-10-CM | POA: Diagnosis present

## 2021-02-23 DIAGNOSIS — J45909 Unspecified asthma, uncomplicated: Secondary | ICD-10-CM | POA: Insufficient documentation

## 2021-02-23 LAB — COMPREHENSIVE METABOLIC PANEL
ALT: 27 U/L (ref 0–44)
AST: 32 U/L (ref 15–41)
Albumin: 5 g/dL (ref 3.5–5.0)
Alkaline Phosphatase: 75 U/L (ref 38–126)
Anion gap: 10 (ref 5–15)
BUN: 10 mg/dL (ref 6–20)
CO2: 26 mmol/L (ref 22–32)
Calcium: 9.3 mg/dL (ref 8.9–10.3)
Chloride: 98 mmol/L (ref 98–111)
Creatinine, Ser: 0.88 mg/dL (ref 0.61–1.24)
GFR, Estimated: >60 mL/min (ref >60)
Glucose, Bld: 102 mg/dL — ABNORMAL HIGH (ref 70–99)
Potassium: 3.8 mmol/L (ref 3.5–5.1)
Sodium: 134 mmol/L — ABNORMAL LOW (ref 135–145)
Total Bilirubin: 1.1 mg/dL (ref 0.3–1.2)
Total Protein: 8.3 g/dL — ABNORMAL HIGH (ref 6.5–8.1)

## 2021-02-23 LAB — CBC
HCT: 45.3 % (ref 39.0–52.0)
Hemoglobin: 16.1 g/dL (ref 13.0–17.0)
MCH: 31.9 pg (ref 26.0–34.0)
MCHC: 35.5 g/dL (ref 30.0–36.0)
MCV: 89.7 fL (ref 80.0–100.0)
Platelets: 238 10*3/uL (ref 150–400)
RBC: 5.05 MIL/uL (ref 4.22–5.81)
RDW: 12.6 % (ref 11.5–15.5)
WBC: 10.4 10*3/uL (ref 4.0–10.5)
nRBC: 0 % (ref 0.0–0.2)

## 2021-02-23 LAB — RESP PANEL BY RT-PCR (FLU A&B, COVID) ARPGX2
Influenza A by PCR: NEGATIVE
Influenza B by PCR: NEGATIVE
SARS Coronavirus 2 by RT PCR: NEGATIVE

## 2021-02-23 LAB — ACETAMINOPHEN LEVEL: Acetaminophen (Tylenol), Serum: <10 ug/mL — ABNORMAL LOW (ref 10–30)

## 2021-02-23 LAB — URINE DRUG SCREEN, QUALITATIVE (ARMC ONLY)
Amphetamines, Ur Screen: NOT DETECTED
Barbiturates, Ur Screen: NOT DETECTED
Benzodiazepine, Ur Scrn: NOT DETECTED
Cannabinoid 50 Ng, Ur ~~LOC~~: NOT DETECTED
Cocaine Metabolite,Ur ~~LOC~~: POSITIVE — AB
MDMA (Ecstasy)Ur Screen: NOT DETECTED
Methadone Scn, Ur: NOT DETECTED
Opiate, Ur Screen: NOT DETECTED
Phencyclidine (PCP) Ur S: NOT DETECTED
Tricyclic, Ur Screen: NOT DETECTED

## 2021-02-23 LAB — ETHANOL: Alcohol, Ethyl (B): 128 mg/dL — ABNORMAL HIGH (ref <10)

## 2021-02-23 LAB — SALICYLATE LEVEL: Salicylate Lvl: <7 mg/dL — ABNORMAL LOW (ref 7.0–30.0)

## 2021-02-23 MED ORDER — IBUPROFEN 600 MG PO TABS
600.0000 mg | ORAL_TABLET | Freq: Once | ORAL | Status: AC
  Administered 2021-02-23: 600 mg via ORAL
  Filled 2021-02-23: qty 1

## 2021-02-23 NOTE — ED Triage Notes (Signed)
Pt to ED via POV with c/o increasing stress, pt states works 2 jobs 6-7 days a week at 2 jobs. Pt states has not been getting along with SO. Pt states thoughts of wanting to hurt himself due to increasing stress, denies plan at this time. Pt A&O x4.

## 2021-02-23 NOTE — ED Notes (Signed)
Pt dressed out from grey shirt, blue jeans, white socks and beige boots, black belts. Pt has wallet, and car keys. Blue underwear.

## 2021-02-23 NOTE — BH Assessment (Signed)
Comprehensive Clinical Assessment (CCA) Note  02/23/2021 Mark Mark [name] [id number]  Mark Mark [name] is a 50 year old male who presents to the ER because he is having thoughts of ending his life. 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 He further reports, he has lived like this since the passing of his father, approximately two years ago. He states he doesn't know how to live because of the stressors of tying to provide for his family. He helps support his sister because she takes care of his mother who have Alzheimer. Due to the stressors, he has substance abuse has increased in the amount and frequency.   During the interview, the patient was calm, cooperative and pleasant. He was able to provide appropriate answers to the questions. He denies HI and AV /H. He also denies history of violence and aggression and no involvement with the legal system.  Chief Complaint: No chief complaint on file.  Visit Diagnosis: Major Depression    CCA Screening, Triage and Referral (STR)  Patient Reported Information How did you hear about Mark? Mark [name]  Referral name: Mark [name]  Referral phone number: No data recorded  Whom do you see for routine medical problems? No data recorded Practice/Facility Name: No data recorded Practice/Facility Phone Number: No data recorded Name of Contact: No data recorded Contact Number: No data recorded Contact Fax Number: No data recorded Prescriber Name: No data recorded Prescriber Address (if known): No data recorded  What Is the Reason for Your Visit/Call Today? Having thoughts of ending his life and increase substance use  How Long Has This Been Causing You Problems? 1 wk - 1 month  What Do You Feel Would Help You the Most Today? Treatment for Depression or other mood problem; Alcohol or Drug Use Treatment   Have You Recently Been in Any Inpatient Treatment (Hospital/Detox/Crisis Center/28-Day  Program)? No  Name/Location of Program/Hospital:No data recorded How Long Were You There? No data recorded When Were You Discharged? No data recorded  Have You Ever Received Services From Mark Mark [name] Before? Yes  [name]   Have You Recently Had Any Thoughts About Hurting Yourself? Yes  Are You Planning to Commit Suicide/Harm Yourself At This time? -- (States, "I really don't know what I'm going to do.")   Have you Recently Had Thoughts About Hurting Someone Mark Mark [name]? No  Explanation: No data recorded  Have You Used Any Alcohol or Drugs in the Past 24 Hours? Yes  How Long Ago Did You Use Drugs or Alcohol? No data recorded What Did You Use and How Much? Today, unable to quantify the amount   Do You Currently Have a Therapist/Psychiatrist? No  Name of Therapist/Psychiatrist: No data recorded  Have You Been Recently Discharged From Any Office Practice or Programs? No  Explanation of Discharge From Practice/Program: No data recorded    CCA Screening Triage Referral Assessment Type of Contact: Face-to-Face  Is this Initial or Reassessment? No data recorded Date Telepsych consult ordered in [facility]:  No data recorded Time Telepsych consult ordered in [facility]:  No data recorded  Patient Reported Information Reviewed? Yes  Patient Left Without Being Seen? No data recorded Reason for Not Completing Assessment: No data recorded  Collateral Involvement: n/a   Does Patient Have a Court Appointed Legal Guardian? No data recorded Name and Contact of Legal Guardian: No data recorded If Minor and  Not Living with Parent(s), Who has Custody? n/a  Is CPS involved or ever been involved? Never  Is APS involved or ever been involved? Never   Patient Determined To Be At Risk for Harm To Mark [name] or Others Based on Review of Patient Reported Information or Presenting Complaint? Yes, for Mark Mark [name]  Method: No data recorded Availability of  Means: No data recorded Intent: No data recorded Notification Required: No data recorded Additional Information for Danger to Others Potential: No data recorded Additional Comments for Danger to Others Potential: No data recorded Are There Guns or Other Weapons in Your Home? No data recorded Types of Guns/Weapons: No data recorded Are These Weapons Safely Secured?                            No data recorded Who Could Verify You Are Able To Have These Secured: No data recorded Do You Have any Outstanding Charges, Pending Court Dates, Parole/Probation? No data recorded Contacted To Inform of Risk of Harm To Mark [name] or Others: No data recorded  Location of Assessment: [facility] ED   Does Patient Present under Involuntary Commitment? No  IVC Papers Initial File Date: No data recorded  [place] of Residence: [place]   Patient Currently Receiving the Following Services: Not Receiving Services   Determination of Need: Emergent (2 hours)   Options For Referral: Inpatient Hospitalization     CCA Biopsychosocial Intake/Chief Complaint:  Having thoughts of ending his life and increase substance use  Current Symptoms/Problems: Increase depression   Patient Reported Schizophrenia/Schizoaffective Diagnosis in Past: No   Strengths: Works two jobs  Preferences: Seeking inpatient treatment  Abilities: Able to cae and provide for his Mark [name]   Type of Services Patient Feels are Needed: Inpatient treatment   Initial Clinical Notes/Concerns: Depression   Mental [name] Symptoms Depression:  Hopelessness   Duration of Depressive symptoms: Greater than two weeks   Mania:  Recklessness   Anxiety:   Difficulty concentrating; Restlessness; Sleep   Psychosis:  None   Duration of Psychotic symptoms: No data recorded  Trauma:  None   Obsessions:  None   Compulsions:  "Driven" to perform behaviors/acts   Inattention:  None   Hyperactivity/Impulsivity:  N/A   Oppositional/Defiant  Behaviors:  None   Emotional Irregularity:  Intense/unstable relationships   Other Mood/Personality Symptoms:  None noted    Mental Status Exam Appearance and Mark [name]-care  Stature:  Average   Weight:  Average weight   Clothing:  Neat/clean; Age-appropriate   Grooming:  Normal   Cosmetic use:  None   Posture/gait:  Normal   Motor activity:  Not Remarkable   Sensorium  Attention:  Normal   Concentration:  Normal   Orientation:  X5   Recall/memory:  Normal   Affect and Mood  Affect:  Appropriate; Depressed; Full Range   Mood:  Depressed; Hopeless   Relating  Eye contact:  Normal   Facial expression:  Depressed   Attitude toward examiner:  Cooperative   Thought and Language  Speech flow: Normal   Thought content:  Appropriate to Mood and Circumstances   Preoccupation:  None   Hallucinations:  None   Organization:  No data recorded  [employer] of Knowledge:  Average   Intelligence:  Average   Abstraction:  Normal   Judgement:  Fair   Reality Testing:  Adequate   Insight:  Fair   Decision Making:  Impulsive   Social Functioning  Social  Maturity:  Impulsive; Isolates   Social Judgement:  "Street Smart"   Stress  Stressors:  Grief/losses; Transitions; Work   Coping Ability:  Exhausted; Overwhelmed   Skill Deficits:  None   Supports:  Family; Friends/Service system     Religion: Religion/Spirituality Are You A Religious Person?: No  Leisure/Recreation:    Exercise/Diet: Exercise/Diet Do You Exercise?: No Have You Gained or Lost A Significant Amount of Weight in the Past Six Months?: No Do You Follow a Special Diet?: No Do You Have Any Trouble Sleeping?: Yes Explanation of Sleeping Difficulties: Unable to get much rest due to multiple jobs. Don't have time to sleep.   CCA Employment/Education Employment/Work Situation: Employment / Work Situation Employment situation: Employed Where is patient currently  employed?: Patient currently have two jobs How long has patient been employed?: Two years Patient's job has been impacted by current illness: No What is the longest time patient has a held a job?: Unable to quantify Where was the patient employed at that time?: Unable to quantify Has patient ever been in the [employer]?: No  Education: Education Is Patient Currently Attending School?: No   CCA Family/Childhood History Family and Relationship History: Family history Marital status: Divorced Are you sexually active?: Yes What is your sexual orientation?: Heterosexual Has your sexual activity been affected by drugs, alcohol, medication, or emotional stress?: None reported Does patient have children?: Yes How many children?: 7 How is patient's relationship with their children?: States it's good  Childhood History:  Childhood History By whom was/is the patient raised?: Both parents Additional childhood history information: Father worked Corporate investment banker jobs Description of patient's relationship with caregiver when they were a child: No problems reported Patient's description of current relationship with people who raised him/her: States it is good How were you disciplined when you got in trouble as a child/adolescent?: Reports of having not problems Does patient have siblings?: Yes Number of Siblings: 2 Description of patient's current relationship with siblings: States it's good Did patient suffer any verbal/emotional/physical/sexual abuse as a child?: No Did patient suffer from severe childhood neglect?: No Has patient ever been sexually abused/assaulted/raped as an adolescent or adult?: No Was the patient ever a victim of a crime or a disaster?: No Witnessed domestic violence?: No Has patient been affected by domestic violence as an adult?: No  Child/Adolescent Assessment:     CCA Substance Use Alcohol/Drug Use: Alcohol / Drug Use Pain Medications: See PTA Prescriptions: See  PTA Over the Counter: See PTA History of alcohol / drug use?: Yes Longest period of sobriety (when/how long): Unable to quantify Negative Consequences of Use: Personal relationships,Work / School,Financial Substance #1 Name of Substance 1: Cocaine 1 - Amount (size/oz): Unable to quantify 1 - Frequency: "About three times a week" 1 - Duration: Unable to quantify 1 - Last Use / Amount: 02/22/2021 1- Route of Use: Smoking Substance #2 Name of Substance 2: Alcohol 2 - Amount (size/oz): Unable to quantify 2 - Frequency: Daily 2 - Last Use / Amount: 02/22/2021 2 - Route of Substance Use: Oral                     ASAM's:  Six Dimensions of Multidimensional Assessment  Dimension 1:  Acute Intoxication and/or Withdrawal Potential:      Dimension 2:  Biomedical Conditions and Complications:      Dimension 3:  Emotional, Behavioral, or Cognitive Conditions and Complications:     Dimension 4:  Readiness to Change:     Dimension  5:  Relapse, Continued use, or Continued Problem Potential:     Dimension 6:  Recovery/Living Environment:     ASAM Severity Score:    ASAM Recommended Level of Treatment:     Substance use Disorder (SUD)    Recommendations for Services/Supports/Treatments:    DSM5 Diagnoses: Patient Active Problem List   Diagnosis Date Noted  . Severe recurrent major depression without psychotic features (HCC) 02/23/2021  . Cocaine abuse (HCC) 02/23/2021  . Alcohol abuse 02/23/2021  . Suicidal ideation 02/23/2021  . Anaphylaxis 12/30/2017    Patient Centered Plan: Patient is on the following Treatment Plan(s):  Depression   Referrals to Alternative Service(s): Referred to Alternative Service(s):   Place:   Date:   Time:    Referred to Alternative Service(s):   Place:   Date:   Time:    Referred to Alternative Service(s):   Place:   Date:   Time:    Referred to Alternative Service(s):   Place:   Date:   Time:     Lilyan Gilford MS, LCAS, [facility],  [name] Therapeutic Triage Specialist 02/23/2021 4:35 PM

## 2021-02-23 NOTE — ED Notes (Signed)
Pt transferred into ED BHU  Room 3   Patient assigned to appropriate care area. Patient oriented to unit/care area: Informed that, for his safety, care areas are designed for safety and monitored by security cameras at all times; Visiting hours and phone times explained to patient. Patient verbalizes understanding, and verbal contract for safety obtained.   Assessment completed  He denies pain at this time

## 2021-02-23 NOTE — ED Notes (Signed)
VOL/Pending Admit 

## 2021-02-23 NOTE — Consult Note (Signed)
[facility] Face-to-Face Psychiatry Consult   Reason for Consult: 50 year old man comes into the hospital voluntarily saying that he is exhausted and depressed Referring Physician:  Vicente Males Patient Identification: KIMBERLY COYE MRN:  [id number] Principal Diagnosis: Severe recurrent major depression without psychotic features (HCC) Diagnosis:  Principal Problem:   Severe recurrent major depression without psychotic features (HCC) Active Problems:   Cocaine abuse (HCC)   Alcohol abuse   Suicidal ideation   Total Time spent with patient: 1 hour  Subjective:   SEMIR BRILL is a 50 y.o. male patient admitted with "I need some help.  I am tired".  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  He does not get to sleep very much.  He also complains about pain and weakness on the left side of his body.  He says he is feeling depressed and down and there is no telling what he might do that he might even have thoughts about killing himself.  He did not have any specific modality that he cited.  Patient appears to have still been slightly intoxicated when he came in and is kind of surly but ultimately cooperative.  Says his mood feels down and depressed and has for 3 years ever since his father passed away.  Finally admits that maybe it has been worse for the last couple weeks because of problems with his girlfriend who it sounds like may have left him or thrown him out or something.  Mood is feeling sad and hopeless.  Suicidal ideation.  No hallucinations.  Says he is drinking daily and using crack cocaine multiple times a week.  At first he seems to play this down and dismiss it as a problem but towards the end of the interview states that he mostly needs help to "stop what I am doing".  When I ask him what he means by that he says "drugs"  Past  Psychiatric History: Patient has a history by his report of alcohol and drug abuse but it does not appear that he has had any kind of specific treatment.  The only psychiatric note I saw in the chart was a consult by Dr. Alycia Rossetti from many years ago when the patient came in having made a suicidal statement to his then wife.  No known actual suicide attempts.  Risk to Self:   Risk to Others:   Prior Inpatient Therapy:   Prior Outpatient Therapy:    Past Medical History:  Past Medical History:  Diagnosis Date  . Asthma   . Bleeding nose     Past Surgical History:  Procedure Laterality Date  . HERNIA REPAIR     Family History: History reviewed. No pertinent family history. Family Psychiatric  History: Denies any Social History:  Social History   Substance and Sexual Activity  Alcohol Use Yes     Social History   Substance and Sexual Activity  Drug Use Never    Social History   Socioeconomic History  . Marital status: Single    Spouse name: Not on file  . Number of children: Not on file  . Years of education: Not on file  . Highest education level: Not on file  Occupational History  . Not on file  Tobacco Use  . Smoking status: Current Every Day Smoker  Types: Cigarettes  . Smokeless tobacco: Never Used  Substance and Sexual Activity  . Alcohol use: Yes  . Drug use: Never  . Sexual activity: Not on file  Other Topics Concern  . Not on file  Social History Narrative  . Not on file   Social Determinants of Health   Financial Resource Strain: Not on file  Food Insecurity: Not on file  Transportation Needs: Not on file  Physical Activity: Not on file  Stress: Not on file  Social Connections: Not on file   Additional Social History:    Allergies:   Allergies  Allergen Reactions  . Contrast Media [Iodinated Diagnostic Agents] Anaphylaxis    Anaphylactic shock.  Sneezing, itching, and throat swelling following IV contrast media bolus today, 12/30/17. Epi-pen  given. Witness: Patton Salles, Lonell Face, Dr. Don Perking.  Marland Kitchen Peanuts [Peanut Oil]     Abdominal pain      Labs:  Results for orders placed or performed during the hospital encounter of 02/23/21 (from the past 48 hour(s))  Comprehensive metabolic panel     Status: Abnormal   Collection Time: 02/23/21 10:51 AM  Result Value Ref Range   Sodium 134 (L) 135 - 145 mmol/L   Potassium 3.8 3.5 - 5.1 mmol/L   Chloride 98 98 - 111 mmol/L   CO2 26 22 - 32 mmol/L   Glucose, Bld 102 (H) 70 - 99 mg/dL    Comment: Glucose reference range applies only to samples taken after fasting for at least 8 hours.   BUN 10 6 - 20 mg/dL   Creatinine, Ser 9.93 0.61 - 1.24 mg/dL   Calcium 9.3 8.9 - 57.0 mg/dL   Total Protein 8.3 (H) 6.5 - 8.1 g/dL   Albumin 5.0 3.5 - 5.0 g/dL   AST 32 15 - 41 U/L   ALT 27 0 - 44 U/L   Alkaline Phosphatase 75 38 - 126 U/L   Total Bilirubin 1.1 0.3 - 1.2 mg/dL   GFR, Estimated >17 >79 mL/min    Comment: (NOTE) Calculated using the CKD-EPI Creatinine Equation (2021)    Anion gap 10 5 - 15    Comment: Performed at [facility], [address] [place], [place] [address]  Ethanol     Status: Abnormal   Collection Time: 02/23/21 10:51 AM  Result Value Ref Range   Alcohol, Ethyl (B) 128 (H) <10 mg/dL    Comment: (NOTE) Lowest detectable limit for serum alcohol is 10 mg/dL.  For medical purposes only. Performed at [facility], [address] [place], [place] [address]   Salicylate level     Status: Abnormal   Collection Time: 02/23/21 10:51 AM  Result Value Ref Range   Salicylate Lvl <7.0 (L) 7.0 - 30.0 mg/dL    Comment: Performed at [facility], [address] [place], [place] [address]  Acetaminophen level     Status: Abnormal   Collection Time: 02/23/21 10:51 AM  Result Value Ref Range   Acetaminophen (Tylenol), Serum <10 (L) 10 - 30 ug/mL    Comment: (NOTE) Therapeutic concentrations vary significantly. A range of 10-30 ug/mL   may be an effective concentration for many patients. However, some  are best treated at concentrations outside of this range. Acetaminophen concentrations >150 ug/mL at 4 hours after ingestion  and >50 ug/mL at 12 hours after ingestion are often associated with  toxic reactions.  Performed at [facility], [address], [place], [place] [address]   cbc  Status: None   Collection Time: 02/23/21 10:51 AM  Result Value Ref Range   WBC 10.4 4.0 - 10.5 K/uL   RBC 5.05 4.22 - 5.81 MIL/uL   Hemoglobin 16.1 13.0 - 17.0 g/dL   HCT 09.845.3 11.939.0 - 14.752.0 %   MCV 89.7 80.0 - 100.0 fL   MCH 31.9 26.0 - 34.0 pg   MCHC 35.5 30.0 - 36.0 g/dL   RDW 82.912.6 56.211.5 - 13.015.5 %   Platelets 238 150 - 400 K/uL   nRBC 0.0 0.0 - 0.2 %    Comment: Performed at [facility], [address] [place], [place] [address]  Urine Drug Screen, Qualitative     Status: Abnormal   Collection Time: 02/23/21 10:51 AM  Result Value Ref Range   Tricyclic, Ur Screen NONE DETECTED NONE DETECTED   Amphetamines, Ur Screen NONE DETECTED NONE DETECTED   MDMA (Ecstasy)Ur Screen NONE DETECTED NONE DETECTED   Cocaine Metabolite,Ur [place] POSITIVE (A) NONE DETECTED   Opiate, Ur Screen NONE DETECTED NONE DETECTED   Phencyclidine (PCP) Ur S NONE DETECTED NONE DETECTED   Cannabinoid 50 Ng, Ur [place] NONE DETECTED NONE DETECTED   Barbiturates, Ur Screen NONE DETECTED NONE DETECTED   Benzodiazepine, Ur Scrn NONE DETECTED NONE DETECTED   Methadone Scn, Ur NONE DETECTED NONE DETECTED    Comment: (NOTE) Tricyclics + metabolites, urine    Cutoff 1000 ng/mL Amphetamines + metabolites, urine  Cutoff 1000 ng/mL MDMA (Ecstasy), urine              Cutoff 500 ng/mL Cocaine Metabolite, urine          Cutoff 300 ng/mL Opiate + metabolites, urine        Cutoff 300 ng/mL Phencyclidine (PCP), urine         Cutoff 25 ng/mL Cannabinoid, urine                 Cutoff 50 ng/mL Barbiturates + metabolites, urine  Cutoff 200  ng/mL Benzodiazepine, urine              Cutoff 200 ng/mL Methadone, urine                   Cutoff 300 ng/mL  The urine drug screen provides only a preliminary, unconfirmed analytical test result and should not be used for non-medical purposes. Clinical consideration and professional judgment should be applied to any positive drug screen result due to possible interfering substances. A more specific alternate chemical method must be used in order to obtain a confirmed analytical result. Gas chromatography / mass spectrometry (GC/MS) is the preferred confirm atory method. Performed at [facility], [address] [place], [place] [address]     No current facility-administered medications for this encounter.   Current Outpatient Medications  Medication Sig Dispense Refill  . albuterol (PROVENTIL HFA;VENTOLIN HFA) 108 (90 Base) MCG/ACT inhaler Inhale 1-2 puffs into the lungs every 6 (six) hours as needed for wheezing or shortness of breath. 1 Inhaler 2  . albuterol (PROVENTIL HFA;VENTOLIN HFA) 108 (90 Base) MCG/ACT inhaler Inhale 2 puffs into the lungs every 6 (six) hours as needed for wheezing or shortness of breath. 1 Inhaler 2  . amoxicillin (AMOXIL) 875 MG tablet Take 1 tablet (875 mg total) by mouth 2 (two) times daily. 14 tablet 0  . cyclobenzaprine (FLEXERIL) 10 MG tablet Take 1 tablet (10 mg total) by mouth 3 (three) times daily as needed. 15 tablet 0  . ibuprofen (ADVIL) 600 MG  tablet Take 1 tablet (600 mg total) by mouth every 8 (eight) hours as needed. 15 tablet 0  . magic mouthwash w/lidocaine SOLN Take 5 mLs by mouth 4 (four) times daily. 240 mL 0  . nicotine (NICODERM CQ - DOSED IN MG/24 HOURS) 14 mg/24hr patch Place 1 patch (14 mg total) onto the skin daily. 28 patch 0  . predniSONE (DELTASONE) 50 MG tablet Take 1 tablet (50 mg total) by mouth daily with breakfast. 5 tablet 0    Musculoskeletal: Strength & Muscle Tone: within normal limits Gait & Station:  normal Patient leans: N/A            Psychiatric Specialty Exam:  Presentation  General Appearance: No data recorded Eye Contact:No data recorded Speech:No data recorded Speech Volume:No data recorded Handedness:No data recorded  Mood and Affect  Mood:No data recorded Affect:No data recorded  Thought Process  Thought Processes:No data recorded Descriptions of Associations:No data recorded Orientation:No data recorded Thought Content:No data recorded History of Schizophrenia/Schizoaffective disorder:No data recorded Duration of Psychotic Symptoms:No data recorded Hallucinations:No data recorded Ideas of Reference:No data recorded Suicidal Thoughts:No data recorded Homicidal Thoughts:No data recorded  Sensorium  Memory:No data recorded Judgment:No data recorded Insight:No data recorded  Executive Functions  Concentration:No data recorded Attention Span:No data recorded Recall:No data recorded Fund of Knowledge:No data recorded Language:No data recorded  Psychomotor Activity  Psychomotor Activity:No data recorded  Assets  Assets:No data recorded  Sleep  Sleep:No data recorded  Physical Exam: Physical Exam Vitals and nursing note reviewed.  Constitutional:      Appearance: Normal appearance.  HENT:     Head: Normocephalic and atraumatic.     Mouth/Throat:     Pharynx: Oropharynx is clear.  Eyes:     Pupils: Pupils are equal, round, and reactive to light.  Cardiovascular:     Rate and Rhythm: Normal rate and regular rhythm.  Pulmonary:     Effort: Pulmonary effort is normal.     Breath sounds: Normal breath sounds.  Abdominal:     General: Abdomen is flat.     Palpations: Abdomen is soft.  Musculoskeletal:        General: Normal range of motion.  Skin:    General: Skin is warm and dry.  Neurological:     General: No focal deficit present.     Mental Status: He is alert. Mental status is at baseline.     Comments: Patient complains of  pain and weakness "all down my left side"  Psychiatric:        Attention and Perception: Attention normal.        Mood and Affect: Mood is depressed.        Thought Content: Thought content normal.        Cognition and Memory: Memory is impaired.        Judgment: Judgment is impulsive.    Review of Systems  Constitutional: Negative.   HENT: Negative.   Eyes: Negative.   Respiratory: Negative.   Cardiovascular: Negative.   Gastrointestinal: Negative.   Musculoskeletal: Negative.   Skin: Negative.   Neurological: Negative.   Psychiatric/Behavioral: Positive for depression, substance abuse and suicidal ideas. Negative for hallucinations and memory loss. The patient is nervous/anxious and has insomnia.    Blood pressure 133/87, pulse 74, temperature 97.7 F (36.5 C), temperature source Oral, resp. rate 20, height 5\' 9"  (1.753 m), weight 79.4 kg, SpO2 98 %. Body mass index is 25.84 kg/m.  Treatment Plan Summary: Plan 50 year old man with  a history of alcohol and cocaine abuse reporting several weeks of depressed mood with suicidal thoughts and feelings of hopelessness.  Says that he absolutely needs to come into the hospital to get some help so that he can get over his behavior problems.  No history of seizures or delirium tremens.  Appears medically stable right now and safe for admission pending the completion of all appropriate lab work.  Case reviewed with the ER doctor and TTS.  Patient will be reviewed with the inpatient unit but I will go ahead and work on orders.  Disposition: Recommend psychiatric Inpatient admission when medically cleared.  Mordecai Rasmussen, MD 02/23/2021 3:41 PM

## 2021-02-23 NOTE — ED Notes (Signed)
Patient reports feeling stressed and SI, with no clear plan to action his thoughts.  has been working 2 jobs lives alone has been abusing cocaine. Reports last time use was last night, reports he smokes cocaine.

## 2021-02-23 NOTE — ED Notes (Signed)
Pt has been discharged from ED and transferred to [facility] for psychiatric inpatient services. No DC signature needed or required at time time. Patient verbalized understanding to disposition.

## 2021-02-23 NOTE — ED Notes (Signed)
Report to receiving nurse, patient moved to BHU rm 3

## 2021-02-23 NOTE — ED Provider Notes (Signed)
[facility] Emergency Department Provider Note   ____________________________________________   Event Date/Time   First MD Initiated Contact with Patient 02/23/21 1118     (approximate)  I have reviewed the triage vital signs and the nursing notes.   HISTORY  Chief Complaint No chief complaint on file.    HPI Mark [name] is a 50 y.o. male with a stated past medical history of asthma who presents for worsening feelings of depression over the last few weeks.  Patient denies any history of psychiatric illness or ever taking single pharmaceuticals in the past.  Patient does endorse alcohol and cocaine abuse.  Patient states that due to increased life stressors including work and significant other he is feeling with suicidal ideation without a plan.  Patient has never made a suicide attempt in the past.  Patient currently denies any homicidal ideation or auditory/visual hallucinations.  Patient currently denies any vision changes, tinnitus, difficulty speaking, facial droop, sore throat, chest pain, shortness of breath, abdominal pain, nausea/vomiting/diarrhea, dysuria, or weakness/numbness/paresthesias in any extremity         Past Medical History:  Diagnosis Date  . Asthma   . Bleeding nose     Patient Active Problem List   Diagnosis Date Noted  . Severe major depression, single episode, without psychotic features (HCC) 02/24/2021  . Severe recurrent major depression without psychotic features (HCC) 02/23/2021  . Cocaine abuse (HCC) 02/23/2021  . Alcohol abuse 02/23/2021  . Suicidal ideation 02/23/2021  . Anaphylaxis 12/30/2017    Past Surgical History:  Procedure Laterality Date  . HERNIA REPAIR      Prior to Admission medications   Medication Sig Start Date End Date Taking? Authorizing Provider  albuterol (PROVENTIL HFA;VENTOLIN HFA) 108 (90 Base) MCG/ACT inhaler Inhale 1-2 puffs into the lungs every 6 (six) hours as needed for wheezing or  shortness of breath. 12/07/15   Hagler, Jami L, PA-C  albuterol (PROVENTIL HFA;VENTOLIN HFA) 108 (90 Base) MCG/ACT inhaler Inhale 2 puffs into the lungs every 6 (six) hours as needed for wheezing or shortness of breath. 09/22/18   Arnaldo Natal, MD  amoxicillin (AMOXIL) 875 MG tablet Take 1 tablet (875 mg total) by mouth 2 (two) times daily. 08/02/20   Cuthriell, Delorise Royals, PA-C  cyclobenzaprine (FLEXERIL) 10 MG tablet Take 1 tablet (10 mg total) by mouth 3 (three) times daily as needed. 07/15/20   Joni Reining, PA-C  ibuprofen (ADVIL) 600 MG tablet Take 1 tablet (600 mg total) by mouth every 8 (eight) hours as needed. 07/15/20   Joni Reining, PA-C  magic mouthwash w/lidocaine SOLN Take 5 mLs by mouth 4 (four) times daily. 08/02/20   Cuthriell, Delorise Royals, PA-C  nicotine (NICODERM CQ - DOSED IN MG/24 HOURS) 14 mg/24hr patch Place 1 patch (14 mg total) onto the skin daily. 01/01/18   Adrian Saran, MD  predniSONE (DELTASONE) 50 MG tablet Take 1 tablet (50 mg total) by mouth daily with breakfast. 08/02/20   Cuthriell, Delorise Royals, PA-C    Allergies Contrast media [iodinated diagnostic agents] and Peanuts [peanut oil]  History reviewed. No pertinent family history.  Social History Social History   Tobacco Use  . Smoking status: Current Every Day Smoker    Types: Cigarettes  . Smokeless tobacco: Never Used  Substance Use Topics  . Alcohol use: Yes  . Drug use: Never    Review of Systems Constitutional: No fever/chills Eyes: No visual changes. ENT: No sore throat. Cardiovascular: Denies chest pain.  Respiratory: Denies shortness of breath. Gastrointestinal: No abdominal pain.  No nausea, no vomiting.  No diarrhea. Genitourinary: Negative for dysuria. Musculoskeletal: Negative for acute arthralgias Skin: Negative for rash. Neurological: Negative for headaches, weakness/numbness/paresthesias in any extremity Psychiatric: Negative for homicidal  ideation   ____________________________________________   PHYSICAL EXAM:  VITAL SIGNS: ED Triage Vitals [02/23/21 1048]  Enc Vitals Group     BP 133/87     Pulse Rate 74     Resp 20     Temp 97.7 F (36.5 C)     Temp Source Oral     SpO2 98 %     Weight 175 lb (79.4 kg)     Height 5\' 9"  (1.753 m)     Head Circumference      Peak Flow      Pain Score 9     Pain Loc      Pain Edu?      Excl. in GC?    Constitutional: Alert and oriented. Well appearing and in no acute distress. Eyes: Conjunctivae are normal. PERRL. Head: Atraumatic. Nose: No congestion/rhinnorhea. Mouth/Throat: Mucous membranes are moist. Neck: No stridor Cardiovascular: Grossly normal heart sounds.  Good peripheral circulation. Respiratory: Normal respiratory effort.  No retractions. Gastrointestinal: Soft and nontender. No distention. Musculoskeletal: No obvious deformities Neurologic:  Normal speech and language. No gross focal neurologic deficits are appreciated. Skin:  Skin is warm and dry. No rash noted. Psychiatric: Mood and affect are normal. Speech and behavior are normal.  ____________________________________________   LABS (all labs ordered are listed, but only abnormal results are displayed)  Labs Reviewed  COMPREHENSIVE METABOLIC PANEL - Abnormal; Notable for the following components:      Result Value   Sodium 134 (*)    Glucose, Bld 102 (*)    Total Protein 8.3 (*)    All other components within normal limits  ETHANOL - Abnormal; Notable for the following components:   Alcohol, Ethyl (B) 128 (*)    All other components within normal limits  SALICYLATE LEVEL - Abnormal; Notable for the following components:   Salicylate Lvl <7.0 (*)    All other components within normal limits  ACETAMINOPHEN LEVEL - Abnormal; Notable for the following components:   Acetaminophen (Tylenol), Serum <10 (*)    All other components within normal limits  URINE DRUG SCREEN, QUALITATIVE ([facility] ONLY) -  Abnormal; Notable for the following components:   Cocaine Metabolite,Ur [place] POSITIVE (*)    All other components within normal limits  RESP PANEL BY RT-PCR (FLU A&B, COVID) ARPGX2  CBC    PROCEDURES  Procedure(s) performed (including Critical Care):  Procedures   ____________________________________________   INITIAL IMPRESSION / ASSESSMENT AND PLAN / ED COURSE  As part of my medical decision making, I reviewed the following data within the electronic MEDICAL RECORD NUMBER Nursing notes reviewed and incorporated, Labs reviewed, EKG interpreted, Old chart reviewed, Radiograph reviewed and Notes from prior ED visits reviewed and incorporated        Thoughts are linear and organized, and patient has no AH, VH, or HI. Prior suicide attempt: Denies Prior Psychiatric Hospitalizations: Denies  Clinically patient displays no overt toxidrome; they are well appearing, with low suspicion for toxic ingestion given history and exam. Thoughts unlikely 2/2 anemia, hypothyroidism, infection, or ICH.  Consult: Psychiatry to evaluate patient for potential hold for danger to self.  Disposition: Admit       ____________________________________________   FINAL CLINICAL IMPRESSION(S) / ED DIAGNOSES  Final diagnoses:  Suicidal ideation     ED Discharge Orders    None       Note:  This document was prepared using Dragon voice recognition software and may include unintentional dictation errors.   Merwyn Katos, MD 02/24/21 [id number]

## 2021-02-23 NOTE — BH Assessment (Addendum)
Patient is to be admitted to [facility] by Dr. Toni Amend.  Attending Physician will be Dr. Neale Burly.   Patient has been assigned to room 305, by [facility] Charge Nurse Britta Mccreedy.   Intake Paper Work has been signed and placed on patient chart.  ER staff is aware of the admission:  Melody, ER Secretary    Dr. Vicente Males, ER MD   Morrie Sheldon, Patient's Nurse   Rosey Bath, Patient Access.  Pt can be transported after 9pm on 02/23/21.

## 2021-02-24 ENCOUNTER — Encounter: Payer: Self-pay | Admitting: Psychiatry

## 2021-02-24 ENCOUNTER — Other Ambulatory Visit: Payer: Self-pay

## 2021-02-24 DIAGNOSIS — Z720 Tobacco use: Secondary | ICD-10-CM | POA: Diagnosis present

## 2021-02-24 DIAGNOSIS — F322 Major depressive disorder, single episode, severe without psychotic features: Secondary | ICD-10-CM | POA: Diagnosis present

## 2021-02-24 DIAGNOSIS — J449 Chronic obstructive pulmonary disease, unspecified: Secondary | ICD-10-CM | POA: Diagnosis present

## 2021-02-24 DIAGNOSIS — M79606 Pain in leg, unspecified: Secondary | ICD-10-CM | POA: Diagnosis present

## 2021-02-24 MED ORDER — TRAZODONE HCL 100 MG PO TABS
100.0000 mg | ORAL_TABLET | Freq: Every evening | ORAL | Status: DC | PRN
  Filled 2021-02-24: qty 1

## 2021-02-24 MED ORDER — ACETAMINOPHEN 325 MG PO TABS
650.0000 mg | ORAL_TABLET | Freq: Four times a day (QID) | ORAL | Status: DC | PRN
  Administered 2021-02-26: 650 mg via ORAL
  Filled 2021-02-24: qty 2

## 2021-02-24 MED ORDER — ALBUTEROL SULFATE HFA 108 (90 BASE) MCG/ACT IN AERS
1.0000 | INHALATION_SPRAY | Freq: Four times a day (QID) | RESPIRATORY_TRACT | Status: DC | PRN
  Filled 2021-02-24: qty 6.7

## 2021-02-24 MED ORDER — MAGNESIUM HYDROXIDE 400 MG/5ML PO SUSP: 30.0000 mL | Freq: Every day | ORAL | Status: DC | PRN

## 2021-02-24 MED ORDER — NICOTINE 21 MG/24HR TD PT24
21.0000 mg | MEDICATED_PATCH | Freq: Every day | TRANSDERMAL | Status: DC
  Administered 2021-02-24 – 2021-02-26 (×3): 21 mg via TRANSDERMAL
  Filled 2021-02-24 (×4): qty 1

## 2021-02-24 MED ORDER — HYDROXYZINE HCL 50 MG PO TABS: 50.0000 mg | ORAL_TABLET | Freq: Three times a day (TID) | ORAL | Status: DC | PRN

## 2021-02-24 MED ORDER — IBUPROFEN 600 MG PO TABS: 600.0000 mg | ORAL_TABLET | Freq: Four times a day (QID) | ORAL | Status: DC | PRN

## 2021-02-24 MED ORDER — ALUM & MAG HYDROXIDE-SIMETH 200-200-20 MG/5ML PO SUSP: 30.0000 mL | ORAL | Status: DC | PRN

## 2021-02-24 NOTE — BHH Group Notes (Signed)
LCSW Group Therapy Note  02/24/2021 1:41 PM  Type of Therapy and Topic:  Group Therapy: Avoiding Self-Sabotaging and Enabling Behaviors  Participation Level:  Did Not Attend   Description of Group:   In this group, patients will learn how to identify obstacles, self-sabotaging and enabling behaviors, as well as: what are they, why do we do them and what needs these behaviors meet. Discuss unhealthy relationships and how to have positive healthy boundaries with those that sabotage and enable. Explore aspects of self-sabotage and enabling in yourself and how to limit these self-destructive behaviors in everyday life.   Therapeutic Goals: 1. Patient will identify one obstacle that relates to self-sabotage and enabling behaviors 2. Patient will identify one personal self-sabotaging or enabling behavior they did prior to admission 3. Patient will state a plan to change the above identified behavior 4. Patient will demonstrate ability to communicate their needs through discussion and/or role play.   Summary of Patient Progress: Patient did not attend group despite encouraged participation.     Therapeutic Modalities:   Cognitive Behavioral Therapy Person-Centered Therapy Motivational Interviewing   Gwenevere Ghazi, MSW, [place], [place] 02/24/2021 1:41 PM

## 2021-02-24 NOTE — Progress Notes (Signed)
Admission Note:  50 yr male who presents IVC in no acute distress for the treatment of  Depression and SI. Patient appears flat and depressed, his thoughts are organized and coherent, he is receptive to staff, he was calm and cooperative with admission process, he currently denies SI/HI/AVH  and contracts for safety upon admission. Patient stated " my depression got worse because l work three jobs and I don't get enough rest. " I am overwhelmed"  Patient  has Past medical Hx of Asthma, Depression, Hemia and Nose bleed. Patient endorses doing cocaine, smokes about 1-2 packs of cigarette and drinks occasionally. Patient's skin was assessed in presence of Andrew MHT and found to be warm, dry and intact. Patient was also searched no contraband found, POC and unit policies explained, understanding verbalized and consents obtained. 15 minutes safety checks maintained will continue to monitor.

## 2021-02-24 NOTE — H&P (Signed)
Psychiatric Admission Assessment Adult  Patient Identification: Mark [name] MRN:  [id number] Date of Evaluation:  02/24/2021 Chief Complaint:  Severe major depression, single episode, without psychotic features (HCC) [F32.2] Principal Diagnosis: Severe major depression, single episode, without psychotic features (HCC) Diagnosis:  Principal Problem:   Severe major depression, single episode, without psychotic features (HCC) Active Problems:   Cocaine abuse (HCC)   Alcohol abuse   Suicidal ideation   Tobacco abuse   COPD (chronic obstructive pulmonary disease) (HCC)   Leg pain  History of Present Illness: Patient seen and chart reviewed.  50 year old man presented to the emergency room reporting symptoms of depression and suicidal ideation.  Patient reports that his mood has been sad down and depressed.  He is feeling hopeless.  Going on for over a week.  Energy level low.  He said that he had suicidal ideation and was thinking he might kill himself.  Patient admitted that he had been using crack cocaine and drinking.  Played this down a bit but ultimately admitted that it was going on several times a week and had gotten worse to the point of having social problems with his girlfriend having thrown him out.  Denied any psychotic symptoms.  Denied any threats of violence.  He is also complaining of pain in his outer side of his left leg especially in the hip area.  Patient not currently engaged in any treatment outside the hospital.  On interview today the patient reports his mood is better than it was yesterday.  He got some sleep last night.  He denies any acute suicidal thought intent or plan.  Denies psychotic symptoms.  Mostly concerned about the pain in his leg.  Vitals reviewed.  Slight elevated blood pressure but does not appear to be in active withdrawal not shaking certainly no sign of delirium not diaphoretic. Associated Signs/Symptoms: Depression Symptoms:  depressed  mood, anhedonia, insomnia, psychomotor retardation, feelings of worthlessness/guilt, difficulty concentrating, suicidal thoughts without plan, Duration of Depression Symptoms: Greater than two weeks  (Hypo) Manic Symptoms:  Distractibility, Anxiety Symptoms:  Excessive Worry, Psychotic Symptoms:  None reported PTSD Symptoms: Negative Total Time spent with patient: 1 hour  Past Psychiatric History: Patient has a history of substance abuse problems mood instability and mood problems especially in the context of drug use.  Today on evaluation he is looking and reporting much better feelings than he had yesterday.  Given current symptoms not clear that there would be any need for antidepressant medicine.  No need for detox at this point.  After reviewing past history looks like probably the best help he could get would be substance abuse.  Patient claims that he has had suicide attempts in the past details are sketchy.  Is the patient at risk to self? Yes.    Has the patient been a risk to self in the past 6 months? No.  Has the patient been a risk to self within the distant past? Yes.    Is the patient a risk to others? No.  Has the patient been a risk to others in the past 6 months? No.  Has the patient been a risk to others within the distant past? No.   Prior Inpatient Therapy:   Prior Outpatient Therapy:    Alcohol Screening: 1. How often do you have a drink containing alcohol?: Monthly or less 2. How many drinks containing alcohol do you have on a typical day when you are drinking?: 1 or 2 3. How often do  you have six or more drinks on one occasion?: Less than monthly AUDIT-C Score: 2 4. How often during the last year have you found that you were not able to stop drinking once you had started?: Never 5. How often during the last year have you failed to do what was normally expected from you because of drinking?: Never 6. How often during the last year have you needed a first drink  in the morning to get yourself going after a heavy drinking session?: Never 7. How often during the last year have you had a feeling of guilt of remorse after drinking?: Never 8. How often during the last year have you been unable to remember what happened the night before because you had been drinking?: Never 9. Have you or someone else been injured as a result of your drinking?: No 10. Has a relative or friend or a doctor or another health worker been concerned about your drinking or suggested you cut down?: No Alcohol Use Disorder Identification Test Final Score (AUDIT): 2 Substance Abuse History in the last 12 months:  Yes.   Consequences of Substance Abuse: Worsening of mood problems with hopelessness and suicidal ideation.  Loss of social contact and contact with family Previous Psychotropic Medications: No  Psychological Evaluations: No  Past Medical History:  Past Medical History:  Diagnosis Date  . Asthma   . Bleeding nose     Past Surgical History:  Procedure Laterality Date  . HERNIA REPAIR     Family History: History reviewed. No pertinent family history. Family Psychiatric  History: Patient denies family history Tobacco Screening: Have you used any form of tobacco in the last 30 days? (Cigarettes, Smokeless Tobacco, Cigars, and/or Pipes): Yes Tobacco use, Select all that apply: 4 or less cigarettes per day Are you interested in Tobacco Cessation Medications?: Yes, will notify MD for an order Counseled patient on smoking cessation including recognizing danger situations, developing coping skills and basic information about quitting provided: Yes Social History:  Social History   Substance and Sexual Activity  Alcohol Use Yes     Social History   Substance and Sexual Activity  Drug Use Never    Additional Social History:                           Allergies:   Allergies  Allergen Reactions  . Contrast Media [Iodinated Diagnostic Agents] Anaphylaxis     Anaphylactic shock.  Sneezing, itching, and throat swelling following IV contrast media bolus today, 12/30/17. Epi-pen given. Witness: Patton Salles, Lonell Face, Dr. Don Perking.  Marland Kitchen Peanuts [Peanut Oil]     Abdominal pain     Lab Results:  Results for orders placed or performed during the hospital encounter of 02/23/21 (from the past 48 hour(s))  Comprehensive metabolic panel     Status: Abnormal   Collection Time: 02/23/21 10:51 AM  Result Value Ref Range   Sodium 134 (L) 135 - 145 mmol/L   Potassium 3.8 3.5 - 5.1 mmol/L   Chloride 98 98 - 111 mmol/L   CO2 26 22 - 32 mmol/L   Glucose, Bld 102 (H) 70 - 99 mg/dL    Comment: Glucose reference range applies only to samples taken after fasting for at least 8 hours.   BUN 10 6 - 20 mg/dL   Creatinine, Ser 7.89 0.61 - 1.24 mg/dL   Calcium 9.3 8.9 - 38.1 mg/dL   Total Protein 8.3 (H) 6.5 - 8.1 g/dL  Albumin 5.0 3.5 - 5.0 g/dL   AST 32 15 - 41 U/L   ALT 27 0 - 44 U/L   Alkaline Phosphatase 75 38 - 126 U/L   Total Bilirubin 1.1 0.3 - 1.2 mg/dL   GFR, Estimated >34 >37 mL/min    Comment: (NOTE) Calculated using the CKD-EPI Creatinine Equation (2021)    Anion gap 10 5 - 15    Comment: Performed at [facility], [address] [place], [place] [address]  Ethanol     Status: Abnormal   Collection Time: 02/23/21 10:51 AM  Result Value Ref Range   Alcohol, Ethyl (B) 128 (H) <10 mg/dL    Comment: (NOTE) Lowest detectable limit for serum alcohol is 10 mg/dL.  For medical purposes only. Performed at [facility], [address] [place], [place] [address]   Salicylate level     Status: Abnormal   Collection Time: 02/23/21 10:51 AM  Result Value Ref Range   Salicylate Lvl <7.0 (L) 7.0 - 30.0 mg/dL    Comment: Performed at [facility], [address] [place], [place] [address]  Acetaminophen level     Status: Abnormal   Collection Time: 02/23/21 10:51 AM  Result Value Ref Range   Acetaminophen  (Tylenol), Serum <10 (L) 10 - 30 ug/mL    Comment: (NOTE) Therapeutic concentrations vary significantly. A range of 10-30 ug/mL  may be an effective concentration for many patients. However, some  are best treated at concentrations outside of this range. Acetaminophen concentrations >150 ug/mL at 4 hours after ingestion  and >50 ug/mL at 12 hours after ingestion are often associated with  toxic reactions.  Performed at [facility], [address] [place], [place] [address]   cbc     Status: None   Collection Time: 02/23/21 10:51 AM  Result Value Ref Range   WBC 10.4 4.0 - 10.5 K/uL   RBC 5.05 4.22 - 5.81 MIL/uL   Hemoglobin 16.1 13.0 - 17.0 g/dL   HCT 87.1 95.9 - 74.7 %   MCV 89.7 80.0 - 100.0 fL   MCH 31.9 26.0 - 34.0 pg   MCHC 35.5 30.0 - 36.0 g/dL   RDW 18.5 50.1 - 58.6 %   Platelets 238 150 - 400 K/uL   nRBC 0.0 0.0 - 0.2 %    Comment: Performed at [facility], [address] [place], [place] [address]  Urine Drug Screen, Qualitative     Status: Abnormal   Collection Time: 02/23/21 10:51 AM  Result Value Ref Range   Tricyclic, Ur Screen NONE DETECTED NONE DETECTED   Amphetamines, Ur Screen NONE DETECTED NONE DETECTED   MDMA (Ecstasy)Ur Screen NONE DETECTED NONE DETECTED   Cocaine Metabolite,Ur [place] POSITIVE (A) NONE DETECTED   Opiate, Ur Screen NONE DETECTED NONE DETECTED   Phencyclidine (PCP) Ur S NONE DETECTED NONE DETECTED   Cannabinoid 50 Ng, Ur [place] NONE DETECTED NONE DETECTED   Barbiturates, Ur Screen NONE DETECTED NONE DETECTED   Benzodiazepine, Ur Scrn NONE DETECTED NONE DETECTED   Methadone Scn, Ur NONE DETECTED NONE DETECTED    Comment: (NOTE) Tricyclics + metabolites, urine    Cutoff 1000 ng/mL Amphetamines + metabolites, urine  Cutoff 1000 ng/mL MDMA (Ecstasy), urine              Cutoff 500 ng/mL Cocaine Metabolite, urine          Cutoff 300 ng/mL Opiate + metabolites, urine        Cutoff  300 ng/mL Phencyclidine (PCP), urine          Cutoff 25 ng/mL Cannabinoid, urine                 Cutoff 50 ng/mL Barbiturates + metabolites, urine  Cutoff 200 ng/mL Benzodiazepine, urine              Cutoff 200 ng/mL Methadone, urine                   Cutoff 300 ng/mL  The urine drug screen provides only a preliminary, unconfirmed analytical test result and should not be used for non-medical purposes. Clinical consideration and professional judgment should be applied to any positive drug screen result due to possible interfering substances. A more specific alternate chemical method must be used in order to obtain a confirmed analytical result. Gas chromatography / mass spectrometry (GC/MS) is the preferred confirm atory method. Performed at [facility], [address] [place], [place] [address]   Resp Panel by RT-PCR (Flu A&B, Covid) Nasopharyngeal Swab     Status: None   Collection Time: 02/23/21  3:30 PM   Specimen: Nasopharyngeal Swab; Nasopharyngeal(NP) swabs in vial transport medium  Result Value Ref Range   SARS Coronavirus 2 by RT PCR NEGATIVE NEGATIVE    Comment: (NOTE) SARS-CoV-2 target nucleic acids are NOT DETECTED.  The SARS-CoV-2 RNA is generally detectable in upper respiratory specimens during the acute phase of infection. The lowest concentration of SARS-CoV-2 viral copies this assay can detect is 138 copies/mL. A negative result does not preclude SARS-Cov-2 infection and should not be used as the sole basis for treatment or other patient management decisions. A negative result may occur with  improper specimen collection/handling, submission of specimen other than nasopharyngeal swab, presence of viral mutation(s) within the areas targeted by this assay, and inadequate number of viral copies(<138 copies/mL). A negative result must be combined with clinical observations, patient history, and epidemiological information. The expected result is Negative.  Fact Sheet for Patients:   BloggerCourse.com  Fact Sheet for Healthcare Providers:  SeriousBroker.it  This test is no t yet approved or cleared by the [place] FDA and  has been authorized for detection and/or diagnosis of SARS-CoV-2 by FDA under an Emergency Use Authorization (EUA). This EUA will remain  in effect (meaning this test can be used) for the duration of the COVID-19 declaration under Section 564(b)(1) of the Act, 21 U.S.C.section 360bbb-3(b)(1), unless the authorization is terminated  or revoked sooner.       Influenza A by PCR NEGATIVE NEGATIVE   Influenza B by PCR NEGATIVE NEGATIVE    Comment: (NOTE) The Xpert Xpress SARS-CoV-2/FLU/RSV plus assay is intended as an aid in the diagnosis of influenza from Nasopharyngeal swab specimens and should not be used as a sole basis for treatment. Nasal washings and aspirates are unacceptable for Xpert Xpress SARS-CoV-2/FLU/RSV testing.  Fact Sheet for Patients: BloggerCourse.com  Fact Sheet for Healthcare Providers: SeriousBroker.it  This test is not yet approved or cleared by the [place] FDA and has been authorized for detection and/or diagnosis of SARS-CoV-2 by FDA under an Emergency Use Authorization (EUA). This EUA will remain in effect (meaning this test can be used) for the duration of the COVID-19 declaration under Section 564(b)(1) of the Act, 21 U.S.C. section 360bbb-3(b)(1), unless the authorization is terminated or revoked.  Performed at [facility], [address] [place], [place] [address]     Blood Alcohol level:  Lab Results  Component Value Date   ETH 128 (H) 02/23/2021    Metabolic Disorder Labs:  No results found for: HGBA1C, MPG No results found for: PROLACTIN No results found for: CHOL, TRIG, HDL, CHOLHDL, VLDL, LDLCALC  Current Medications: Current Facility-Administered Medications  Medication  Dose Route Frequency Provider Last Rate Last Admin  . acetaminophen (TYLENOL) tablet 650 mg  650 mg Oral Q6H PRN Ronica Vivian T, MD      . albuterol (VENTOLIN HFA) 108 (90 Base) MCG/ACT inhaler 1-2 puff  1-2 puff Inhalation Q6H PRN Giavanna Kang T, MD      . alum & mag hydroxide-simeth (MAALOX/MYLANTA) 200-200-20 MG/5ML suspension 30 mL  30 mL Oral Q4H PRN Lebron Nauert T, MD      . hydrOXYzine (ATARAX/VISTARIL) tablet 50 mg  50 mg Oral TID PRN Nashley Cordoba T, MD      . ibuprofen (ADVIL) tablet 600 mg  600 mg Oral Q6H PRN Shermeka Rutt T, MD      . magnesium hydroxide (MILK OF MAGNESIA) suspension 30 mL  30 mL Oral Daily PRN Danissa Rundle T, MD      . nicotine (NICODERM CQ - dosed in mg/24 hours) patch 21 mg  21 mg Transdermal Daily Ky Moskowitz T, MD      . traZODone (DESYREL) tablet 100 mg  100 mg Oral QHS PRN Zakariah Urwin, Jackquline Denmark, MD       PTA Medications: Medications Prior to Admission  Medication Sig Dispense Refill Last Dose  . albuterol (PROVENTIL HFA;VENTOLIN HFA) 108 (90 Base) MCG/ACT inhaler Inhale 1-2 puffs into the lungs every 6 (six) hours as needed for wheezing or shortness of breath. 1 Inhaler 2   . albuterol (PROVENTIL HFA;VENTOLIN HFA) 108 (90 Base) MCG/ACT inhaler Inhale 2 puffs into the lungs every 6 (six) hours as needed for wheezing or shortness of breath. 1 Inhaler 2   . amoxicillin (AMOXIL) 875 MG tablet Take 1 tablet (875 mg total) by mouth 2 (two) times daily. 14 tablet 0   . cyclobenzaprine (FLEXERIL) 10 MG tablet Take 1 tablet (10 mg total) by mouth 3 (three) times daily as needed. 15 tablet 0   . ibuprofen (ADVIL) 600 MG tablet Take 1 tablet (600 mg total) by mouth every 8 (eight) hours as needed. 15 tablet 0   . magic mouthwash w/lidocaine SOLN Take 5 mLs by mouth 4 (four) times daily. 240 mL 0   . nicotine (NICODERM CQ - DOSED IN MG/24 HOURS) 14 mg/24hr patch Place 1 patch (14 mg total) onto the skin daily. 28 patch 0   . predniSONE (DELTASONE) 50 MG tablet Take 1  tablet (50 mg total) by mouth daily with breakfast. 5 tablet 0     Musculoskeletal: Strength & Muscle Tone: within normal limits Gait & Station: normal Patient leans: N/A            Psychiatric Specialty Exam:  Presentation  General Appearance: No data recorded Eye Contact:No data recorded Speech:No data recorded Speech Volume:No data recorded Handedness:No data recorded  Mood and Affect  Mood:No data recorded Affect:No data recorded  Thought Process  Thought Processes:No data recorded Duration of Psychotic Symptoms: No data recorded Past Diagnosis of Schizophrenia or Psychoactive disorder: No  Descriptions of Associations:No data recorded Orientation:No data recorded Thought Content:No data recorded Hallucinations:No data recorded Ideas of Reference:No data recorded Suicidal Thoughts:No data recorded Homicidal Thoughts:No data recorded  Sensorium  Memory:No data recorded Judgment:No data recorded Insight:No data recorded  Executive Functions  Concentration:No data recorded Attention  Span:No data recorded Recall:No data recorded Fund of Knowledge:No data recorded Language:No data recorded  Psychomotor Activity  Psychomotor Activity:No data recorded  Assets  Assets:No data recorded  Sleep  Sleep:No data recorded   Physical Exam: Physical Exam Vitals and nursing note reviewed.  Constitutional:      Appearance: Normal appearance.  HENT:     Head: Normocephalic and atraumatic.     Mouth/Throat:     Pharynx: Oropharynx is clear.  Eyes:     Pupils: Pupils are equal, round, and reactive to light.  Cardiovascular:     Rate and Rhythm: Normal rate and regular rhythm.  Pulmonary:     Effort: Pulmonary effort is normal.     Breath sounds: Normal breath sounds.  Abdominal:     General: Abdomen is flat.     Palpations: Abdomen is soft.  Musculoskeletal:        General: Normal range of motion.  Skin:    General: Skin is warm and dry.   Neurological:     General: No focal deficit present.     Mental Status: He is alert. Mental status is at baseline.  Psychiatric:        Attention and Perception: Attention normal.        Mood and Affect: Mood is anxious.        Speech: Speech normal.        Behavior: Behavior is cooperative.        Thought Content: Thought content normal. Thought content is not paranoid. Thought content does not include suicidal ideation.        Cognition and Memory: Cognition normal.        Judgment: Judgment is impulsive.    Review of Systems  Constitutional: Negative.   HENT: Negative.   Eyes: Negative.   Respiratory: Negative.   Cardiovascular: Negative.   Gastrointestinal: Negative.   Musculoskeletal: Positive for joint pain and myalgias.  Skin: Negative.   Neurological: Negative.   Psychiatric/Behavioral: Positive for depression and substance abuse. Negative for hallucinations and suicidal ideas. The patient is nervous/anxious.    Blood pressure (!) 150/89, pulse 60, temperature 98.1 F (36.7 C), temperature source Oral, resp. rate 16, height  (1.676 m), weight 78 kg, SpO2 96 %. Body mass index is 27.76 kg/m.  Treatment Plan Summary: Medication management and Plan No need at this point for alcohol detox orders.  Continue 15-minute checks.  Encourage patient to be up out of bed talking with social work and nursing.  Encouraged him to think about what his longer term needs would best be.  No change to medications except to add the nicotine patch that he is needed previously.  As needed medicines are available.  Also added as needed ibuprofen patient's complaint of pain in his legs sounds like it is most likely bursitis.  Almost certainly some inflammatory condition.  I do not see a clear indication for radiologic work-up but encouraged the patient to take the Motrin for now.  Observation Level/Precautions:  15 minute checks  Laboratory:  No new labs needed  Psychotherapy:    Medications:     Consultations:    Discharge Concerns:    Estimated LOS:  Other:     Physician Treatment Plan for Primary Diagnosis: Severe major depression, single episode, without psychotic features (HCC) Long Term Goal(s): Improvement in symptoms so as ready for discharge  Short Term Goals: Ability to disclose and discuss suicidal ideas and Ability to demonstrate self-control will improve  Physician Treatment Plan  for Secondary Diagnosis: Principal Problem:   Severe major depression, single episode, without psychotic features (HCC) Active Problems:   Cocaine abuse (HCC)   Alcohol abuse   Suicidal ideation   Tobacco abuse   COPD (chronic obstructive pulmonary disease) (HCC)   Leg pain  Long Term Goal(s): Improvement in symptoms so as ready for discharge  Short Term Goals: Ability to identify and develop effective coping behaviors will improve and Ability to maintain clinical measurements within normal limits will improve  I certify that inpatient services furnished can reasonably be expected to improve the patient's condition.    Mordecai Rasmussen, MD 4/16/202211:07 AM

## 2021-02-24 NOTE — Progress Notes (Signed)
Patient is calm and cooperative with assessment. Patient denies SI, HI, and AVH. He states, "I am ready to go back to work". Patient stated he slept well. Support and encouragement provided. Patient remains safe on the unit at this time and q15 min safety checks are maintained.

## 2021-02-24 NOTE — [facility] Suicide Risk Assessment (Signed)
[facility] INPATIENT:  Family/Significant Other Suicide Prevention Education  Suicide Prevention Education:  Contact Attempts: Margo Common, (Niece's Boyfriend, [phone]) has been identified by the patient as the family member/significant other with whom the patient will be residing, and identified as the person(s) who will aid the patient in the event of a mental health crisis.  With written consent from the patient, two attempts were made to provide suicide prevention education, prior to and/or following the patient's discharge.  We were unsuccessful in providing suicide prevention education.  A suicide education pamphlet was given to the patient to share with family/significant other.  Date and time of first attempt: 24 Feb 2021 @ 1246 Date and time of second attempt:CSW will make additional attempts to reach collateral.   Corky Crafts 02/24/2021, 12:46 PM

## 2021-02-24 NOTE — [facility] Group Notes (Signed)
[facility] Group Notes:  (Nursing/MHT/Case Management/Adjunct)  Date:  02/24/2021  Time:  8:51 PM  Type of Therapy:  Group Therapy  Participation Level:  Active  Participation Quality:  Appropriate  Affect:  Appropriate  Cognitive:  Alert  Insight:  Good  Engagement in Group:  Engaged and goal is to go back to work.  Modes of Intervention:  Support  Summary of Progress/Problems:  Mayra Neer 02/24/2021, 8:51 PM

## 2021-02-24 NOTE — [facility] Counselor (Signed)
Adult Comprehensive Assessment  Patient ID: Mark [name], male   DOB: 10-15-1971, 50 y.o.   MRN: [id number]  Information Source: Information source: Patient  Current Stressors:  Patient states their primary concerns and needs for treatment are:: "stressed cause i work all the time" Patient states their goals for this hospitilization and ongoing recovery are:: "rest and stop smoking" Educational / Learning stressors: none reported Employment / Job issues: "too much work" Family Relationships: "goodEngineer, petroleum / Lack of resources (include bankruptcy): none reported Housing / Lack of housing: none reported Physical health (include injuries & life threatening diseases): none reported Social relationships: none reported Substance abuse: see substance use section Bereavement / Loss: "lost cousin and aunt . . . cousin was murdered in [place] . . taking it ok"  Living/Environment/Situation:  Living Arrangements: Other relatives Living conditions (as described by patient or guardian): "comfortable" Who else lives in the home?: godfather How long has patient lived in current situation?: 5 years What is atmosphere in current home: Comfortable,Temporary  Family History:  Marital status: Divorced Divorced, when?: 2017 What types of issues is patient dealing with in the relationship?: reports cordial relationship with ex-wife Additional relationship information: none reported Are you sexually active?: Yes What is your sexual orientation?: Heterosexual Has your sexual activity been affected by drugs, alcohol, medication, or emotional stress?: None reported Does patient have children?: No How many children?:  (conflicting report, prior report indicates 7 children, unclear if the children are those of ex-wife.) How is patient's relationship with their children?: N/A  Childhood History:  By whom was/is the patient raised?: Both parents Additional childhood history information: Father worked  Corporate investment banker jobs Description of patient's relationship with caregiver when they were a child: "good" Patient's description of current relationship with people who raised him/her: "good" How were you disciplined when you got in trouble as a child/adolescent?: "good" Does patient have siblings?: Yes Number of Siblings: 2 Description of patient's current relationship with siblings: "good" Did patient suffer any verbal/emotional/physical/sexual abuse as a child?: No Did patient suffer from severe childhood neglect?: No Has patient ever been sexually abused/assaulted/raped as an adolescent or adult?: No Was the patient ever a victim of a crime or a disaster?: No Witnessed domestic violence?: No Has patient been affected by domestic violence as an adult?: No  Education:  Highest grade of school patient has completed: Some Automotive engineer Currently a student?: No Learning disability?: No  Employment/Work Situation:   Employment situation: Employed Where is patient currently employed?: 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 Has patient ever been in the [employer]?: No  Financial Resources:   Surveyor, quantity resources: Income from employment Does patient have a representative payee or guardian?: No  Alcohol/Substance Abuse:   What has been your use of drugs/alcohol within the last 12 months?: Reports using $40 worth of cocaine (rock) every 1-2 weeks If attempted suicide, did drugs/alcohol play a role in this?: No Alcohol/Substance Abuse Treatment Hx: Attends AA/NA If yes, describe treatment: Prior hospitalizations Has alcohol/substance abuse ever caused legal problems?: Yes (Possession charges.)  Social Support System:   Patient's Community Support System: Good Describe  Community Support System: Patient reports being close with family. Type of faith/religion: none How does patient's faith help to cope with current illness?: n/a  Leisure/Recreation:  Do You Have Hobbies?: Yes Leisure and Hobbies: Fishing, clubing  Strengths/Needs:   What is the patient's perception of their strengths?: patient reports that he is a helpfull and generous person. Patient states they can use these personal strengths during their treatment to contribute to their recovery: patient endorses Patient states these barriers may affect/interfere with their treatment: none reported Patient states these barriers may affect their return to the community: none reported Other important information patient would like considered in planning for their treatment: none reported  Discharge Plan:   Currently receiving community mental health services: No Patient states concerns and preferences for aftercare planning are: Patient interested in mental health referrals at discharge, intersted in smoking cesation. Patient states they will know when they are safe and ready for discharge when: patient unaware. Does patient have access to transportation?: Yes Does patient have financial barriers related to discharge medications?: No (no insurance.) Will patient be returning to same living situation after discharge?: Yes   Summary/Recommendations:   Summary and Recommendations (to be completed by the evaluator): Patient is a 50 year old [place] Patient is employed at [employer] and [employer]; reports that he works too much. Patient has a primary diagnosis of MDD, single episode, severe w/ out psychotic features; comorbid with stimulant (cocaine) use disorder, severe. Patient presented to the ED following passive suicidal ideation and feeling depressed. During assessment, patient presents as oriented x4, euthymic, mood congruent. Patient forthcoming with information. Minimized  symptoms of depression and SI. Patient is interested in community mental health referrals to include SUD outpatient tx. Provided consent for aftercare referral and collateral Mark [name], "niece's boyfriend, [phone]). Recommendations include: crisis stabilization, therapeutic milieu, encouraging group attendance and participation, medication management for mood stabilization and development of comprehensive mental wellness plan.  Mark [name]. 02/24/2021

## 2021-02-24 NOTE — Tx Team (Signed)
Initial Treatment Plan 02/24/2021 3:33 AM Mark [name] HUD:[id number]    PATIENT STRESSORS: Financial difficulties Occupational concerns   PATIENT STRENGTHS: Capable of independent living General fund of knowledge Motivation for treatment/growth   PATIENT IDENTIFIED PROBLEMS: Depression     Suicidal ideation                  DISCHARGE CRITERIA:  Improved stabilization in mood, thinking, and/or behavior  PRELIMINARY DISCHARGE PLAN: Outpatient therapy  PATIENT/FAMILY INVOLVEMENT: This treatment plan has been presented to and reviewed with the patient, Mark [name], Mark [name] patient and family have been given the opportunity to ask questions and make suggestions.  Mark Ore, RN 02/24/2021, 3:33 AM

## 2021-02-24 NOTE — [facility] Suicide Risk Assessment (Signed)
[facility] Admission Suicide Risk Assessment   Nursing information obtained from:  Patient Demographic factors:  Male Current Mental Status:  NA Loss Factors:  Financial problems / change in socioeconomic status Historical Factors:  Impulsivity Risk Reduction Factors:  Positive social support,Employed  Total Time spent with patient: 1 hour Principal Problem: Severe major depression, single episode, without psychotic features (HCC) Diagnosis:  Principal Problem:   Severe major depression, single episode, without psychotic features (HCC) Active Problems:   Cocaine abuse (HCC)   Alcohol abuse   Suicidal ideation   Tobacco abuse   COPD (chronic obstructive pulmonary disease) (HCC)   Leg pain  Subjective Data: See intake note.  14 50 year old man with substance abuse and depression.  Expressing acute suicidal ideation in the emergency room but not in that now denies any acute suicidal ideation.  Continue 15-minute checks.  Continue daily assessment of mood but at this point seems to be stabilizing  Continued Clinical Symptoms:  Alcohol Use Disorder Identification Test Final Score (AUDIT): 2 The "Alcohol Use Disorders Identification Test", Guidelines for Use in Primary Care, Second Edition.  World Science writer [facility]). Score between 0-7:  no or low risk or alcohol related problems. Score between 8-15:  moderate risk of alcohol related problems. Score between 16-19:  high risk of alcohol related problems. Score 20 or above:  warrants further diagnostic evaluation for alcohol dependence and treatment.   CLINICAL FACTORS:   Depression:   Comorbid alcohol abuse/dependence Alcohol/Substance Abuse/Dependencies   Musculoskeletal: Strength & Muscle Tone: within normal limits Gait & Station: normal Patient leans: N/A  Psychiatric Specialty Exam:  Presentation  General Appearance: No data recorded Eye Contact:No data recorded Speech:No data recorded Speech Volume:No data  recorded Handedness:No data recorded  Mood and Affect  Mood:No data recorded Affect:No data recorded  Thought Process  Thought Processes:No data recorded Descriptions of Associations:No data recorded Orientation:No data recorded Thought Content:No data recorded History of Schizophrenia/Schizoaffective disorder:No  Duration of Psychotic Symptoms:No data recorded Hallucinations:No data recorded Ideas of Reference:No data recorded Suicidal Thoughts:No data recorded Homicidal Thoughts:No data recorded  Sensorium  Memory:No data recorded Judgment:No data recorded Insight:No data recorded  Executive Functions  Concentration:No data recorded Attention Span:No data recorded Recall:No data recorded Fund of Knowledge:No data recorded Language:No data recorded  Psychomotor Activity  Psychomotor Activity:No data recorded  Assets  Assets:No data recorded  Sleep  Sleep:No data recorded   Physical Exam: Physical Exam Vitals and nursing note reviewed.  Constitutional:      Appearance: Normal appearance.  HENT:     Head: Normocephalic and atraumatic.     Mouth/Throat:     Pharynx: Oropharynx is clear.  Eyes:     Pupils: Pupils are equal, round, and reactive to light.  Cardiovascular:     Rate and Rhythm: Normal rate and regular rhythm.  Pulmonary:     Effort: Pulmonary effort is normal.     Breath sounds: Normal breath sounds.  Abdominal:     General: Abdomen is flat.     Palpations: Abdomen is soft.  Musculoskeletal:        General: Normal range of motion.  Skin:    General: Skin is warm and dry.  Neurological:     General: No focal deficit present.     Mental Status: He is alert. Mental status is at baseline.  Psychiatric:        Attention and Perception: Attention normal.        Mood and Affect: Mood is depressed.  Speech: Speech normal.        Behavior: Behavior is cooperative.        Thought Content: Thought content normal. Thought content does not  include suicidal ideation.        Cognition and Memory: Cognition normal.    Review of Systems  Constitutional: Negative.   HENT: Negative.   Eyes: Negative.   Respiratory: Negative.   Cardiovascular: Negative.   Gastrointestinal: Negative.   Musculoskeletal: Negative.   Skin: Negative.   Neurological: Negative.   Psychiatric/Behavioral: Positive for depression and substance abuse. Negative for suicidal ideas.   Blood pressure (!) 150/89, pulse 60, temperature 98.1 F (36.7 C), temperature source Oral, resp. rate 16, height 5\' 6"  (1.676 m), weight 78 kg, SpO2 96 %. Body mass index is 27.76 kg/m.   COGNITIVE FEATURES THAT CONTRIBUTE TO RISK:  Thought constriction (tunnel vision)    SUICIDE RISK:   Mild:  Suicidal ideation of limited frequency, intensity, duration, and specificity.  There are no identifiable plans, no associated intent, mild dysphoria and related symptoms, good self-control (both objective and subjective assessment), few other risk factors, and identifiable protective factors, including available and accessible social support.  PLAN OF CARE: Continue 15-minute checks.  Engage in individual and group assessment and therapy.  Reassess vitals.  Talk with patient about outpatient treatment options.  Reassess suicidality in an ongoing way prior to discharge  I certify that inpatient services furnished can reasonably be expected to improve the patient's condition.   , MD 02/24/2021, 11:13 AM

## 2021-02-25 NOTE — [facility] Suicide Risk Assessment (Signed)
[facility] INPATIENT:  Family/Significant Other Suicide Prevention Education  Suicide Prevention Education:  Education Completed; Margo Common, (Niece's Boyfriend, [phone] been identified by the patient as the family member/significant other with whom the patient will be residing, and identified as the person(s) who will aid the patient in the event of a mental health crisis (suicidal ideations/suicide attempt).  With written consent from the patient, the family member/significant other has been provided the following suicide prevention education, prior to the and/or following the discharge of the patient.  The suicide prevention education provided includes the following:  Suicide risk factors  Suicide prevention and interventions  National Suicide Hotline telephone number  [facility] assessment telephone number  [facility] Emergency Assistance 911  [facility] and/or Residential Mobile Crisis Unit telephone number  Request made of family/significant other to:  Remove weapons (e.g., guns, rifles, knives), all items previously/currently identified as safety concern.    Remove drugs/medications (over-the-counter, prescriptions, illicit drugs), all items previously/currently identified as a safety concern.  The family member/significant other verbalizes understanding of the suicide prevention education information provided.  The family member/significant other agrees to remove the items of safety concern listed above.  Mr. Eliberto Ivory stated he is a peer support and familiar with mental health and suicide. Mr. Eliberto Ivory also stated he took the mental health 1st aide course.   Carollee Herter  Livan Hires 02/25/2021, 2:37 PM

## 2021-02-25 NOTE — Progress Notes (Signed)
The patient has been isolative to his room, except for medication and meal times. He denies SI/HI AVH, no physical complaints. Denies hopelessness, anxiety, and depression. Will continue to monitor and provide support. 15 min safety checks are continuous.

## 2021-02-25 NOTE — Plan of Care (Signed)
D: Assumed care of patient @ 1900. Patient denies SI/HI/AVH. Patient presents with appropriate mood and affect. No signs of distress or injury.   A: Patient was provided with verbal education on provided medications. Patient care plan was reviewed. Patient was offered support and encouragement. Patient was encourage to attend groups, participate in unit activities and continue with plan of care.    R: Patient has no complaints of pain at this time. Patient is receptive to treatment and safety maintained on unit.     Problem: Education: Goal: Utilization of techniques to improve thought processes will improve Outcome: Not Progressing Goal: Knowledge of the prescribed therapeutic regimen will improve Outcome: Not Progressing   Problem: Activity: Goal: Interest or engagement in leisure activities will improve Outcome: Progressing

## 2021-02-25 NOTE — Plan of Care (Signed)
  Problem: Consults Goal: Concurrent Medical Patient Education Description: (See Patient Education Module for education specifics) Outcome: Progressing   Problem: B[facility] Concurrent Medical Problem Goal: LTG-Pt will be physically stable and he/significant other Description: (Patient will be physically stable and he/significant other will be able to verbalize understanding of follow-up care and symptoms that would warrant further treatment) Outcome: Progressing Goal: STG-Vital signs will be within defined limits or stabilized Description: (STG- Vital signs will be within defined limits or stabilized for individual) Outcome: Progressing Goal: STG-Compliance with medication and/or treatment as ordered Description: (STG-Compliance with medication and/or treatment as ordered by MD) Outcome: Progressing Goal: STG-Verbalize two symptoms that would warrant further Description: (STG-Verbalize two symptoms that would warrant further treatment) Outcome: Progressing Goal: STG-Patient will participate in management/stabilization Description: (STG-Patient will participate in management/stabilization of medical condition) Outcome: Progressing Goal: STG-Other (Specify): Description: STG-Other Concurrent Medical (Specify): Outcome: Progressing   Problem: Education: Goal: Utilization of techniques to improve thought processes will improve Outcome: Progressing Goal: Knowledge of the prescribed therapeutic regimen will improve Outcome: Progressing   Problem: Activity: Goal: Interest or engagement in leisure activities will improve Outcome: Progressing Goal: Imbalance in normal sleep/wake cycle will improve Outcome: Progressing   Problem: Coping: Goal: Coping ability will improve Outcome: Progressing Goal: Will verbalize feelings Outcome: Progressing   Problem: Health Behavior/Discharge Planning: Goal: Ability to make decisions will improve Outcome: Progressing Goal: Compliance with therapeutic  regimen will improve Outcome: Progressing   Problem: Role Relationship: Goal: Will demonstrate positive changes in social behaviors and relationships Outcome: Progressing   Problem: Safety: Goal: Ability to disclose and discuss suicidal ideas will improve Outcome: Progressing Goal: Ability to identify and utilize support systems that promote safety will improve Outcome: Progressing   Problem: Self-Concept: Goal: Will verbalize positive feelings about self Outcome: Progressing Goal: Level of anxiety will decrease Outcome: Progressing

## 2021-02-25 NOTE — Progress Notes (Signed)
[facility] MD Progress Note  02/25/2021 12:44 PM Mark [name]  MRN:  [id number] Subjective: Follow-up for this 50 year old man who came in with complaints of depressive mood and drug and alcohol abuse.  Patient seen and chart reviewed.  Patient reports he is feeling much better today.  Denies feeling depressed or hopeless.  Denies suicidal thoughts.  Does not appear disorganized or psychotic.  Not irritable but pleasant in his affect.  Limping a little bit but the pain is improved in his leg and not as big a complaint.  No new labs.  Vitals stable Principal Problem: Severe major depression, single episode, without psychotic features (HCC) Diagnosis: Principal Problem:   Severe major depression, single episode, without psychotic features (HCC) Active Problems:   Cocaine abuse (HCC)   Alcohol abuse   Suicidal ideation   Tobacco abuse   COPD (chronic obstructive pulmonary disease) (HCC)   Leg pain  Total Time spent with patient: 30 minutes  Past Psychiatric History: Past history of substance abuse and depressive symptoms now improving.  Past Medical History:  Past Medical History:  Diagnosis Date  . Asthma   . Bleeding nose     Past Surgical History:  Procedure Laterality Date  . HERNIA REPAIR     Family History: History reviewed. No pertinent family history. Family Psychiatric  History: See previous Social History:  Social History   Substance and Sexual Activity  Alcohol Use Yes     Social History   Substance and Sexual Activity  Drug Use Never    Social History   Socioeconomic History  . Marital status: Single    Spouse name: Not on file  . Number of children: Not on file  . Years of education: Not on file  . Highest education level: Not on file  Occupational History  . Not on file  Tobacco Use  . Smoking status: Current Every Day Smoker    Types: Cigarettes  . Smokeless tobacco: Never Used  Substance and Sexual Activity  . Alcohol use: Yes  . Drug use: Never  .  Sexual activity: Not Currently  Other Topics Concern  . Not on file  Social History Narrative  . Not on file   Social Determinants of Health   Financial Resource Strain: Not on file  Food Insecurity: Not on file  Transportation Needs: Not on file  Physical Activity: Not on file  Stress: Not on file  Social Connections: Not on file   Additional Social History:                         Sleep: Fair  Appetite:  Fair  Current Medications: Current Facility-Administered Medications  Medication Dose Route Frequency Provider Last Rate Last Admin  . acetaminophen (TYLENOL) tablet 650 mg  650 mg Oral Q6H PRN Manika Hast T, MD      . albuterol (VENTOLIN HFA) 108 (90 Base) MCG/ACT inhaler 1-2 puff  1-2 puff Inhalation Q6H PRN Nile Dorning T, MD      . alum & mag hydroxide-simeth (MAALOX/MYLANTA) 200-200-20 MG/5ML suspension 30 mL  30 mL Oral Q4H PRN Torrence Hammack T, MD      . hydrOXYzine (ATARAX/VISTARIL) tablet 50 mg  50 mg Oral TID PRN Tameeka Luo T, MD      . ibuprofen (ADVIL) tablet 600 mg  600 mg Oral Q6H PRN Aerion Bagdasarian T, MD      . magnesium hydroxide (MILK OF MAGNESIA) suspension 30 mL  30 mL Oral  Daily PRN Mashawn Brazil, Jackquline Denmark, MD      . nicotine (NICODERM CQ - dosed in mg/24 hours) patch 21 mg  21 mg Transdermal Daily Nivia Gervase, Jackquline Denmark, MD   21 mg at 02/25/21 0843  . traZODone (DESYREL) tablet 100 mg  100 mg Oral QHS PRN Juell Radney, Jackquline Denmark, MD        Lab Results:  Results for orders placed or performed during the hospital encounter of 02/23/21 (from the past 48 hour(s))  Resp Panel by RT-PCR (Flu A&B, Covid) Nasopharyngeal Swab     Status: None   Collection Time: 02/23/21  3:30 PM   Specimen: Nasopharyngeal Swab; Nasopharyngeal(NP) swabs in vial transport medium  Result Value Ref Range   SARS Coronavirus 2 by RT PCR NEGATIVE NEGATIVE    Comment: (NOTE) SARS-CoV-2 target nucleic acids are NOT DETECTED.  The SARS-CoV-2 RNA is generally detectable in upper  respiratory specimens during the acute phase of infection. The lowest concentration of SARS-CoV-2 viral copies this assay can detect is 138 copies/mL. A negative result does not preclude SARS-Cov-2 infection and should not be used as the sole basis for treatment or other patient management decisions. A negative result may occur with  improper specimen collection/handling, submission of specimen other than nasopharyngeal swab, presence of viral mutation(s) within the areas targeted by this assay, and inadequate number of viral copies(<138 copies/mL). A negative result must be combined with clinical observations, patient history, and epidemiological information. The expected result is Negative.  Fact Sheet for Patients:  BloggerCourse.com  Fact Sheet for Healthcare Providers:  SeriousBroker.it  This test is no t yet approved or cleared by the [place] FDA and  has been authorized for detection and/or diagnosis of SARS-CoV-2 by FDA under an Emergency Use Authorization (EUA). This EUA will remain  in effect (meaning this test can be used) for the duration of the COVID-19 declaration under Section 564(b)(1) of the Act, 21 U.S.C.section 360bbb-3(b)(1), unless the authorization is terminated  or revoked sooner.       Influenza A by PCR NEGATIVE NEGATIVE   Influenza B by PCR NEGATIVE NEGATIVE    Comment: (NOTE) The Xpert Xpress SARS-CoV-2/FLU/RSV plus assay is intended as an aid in the diagnosis of influenza from Nasopharyngeal swab specimens and should not be used as a sole basis for treatment. Nasal washings and aspirates are unacceptable for Xpert Xpress SARS-CoV-2/FLU/RSV testing.  Fact Sheet for Patients: BloggerCourse.com  Fact Sheet for Healthcare Providers: SeriousBroker.it  This test is not yet approved or cleared by the [place] FDA and has been authorized for  detection and/or diagnosis of SARS-CoV-2 by FDA under an Emergency Use Authorization (EUA). This EUA will remain in effect (meaning this test can be used) for the duration of the COVID-19 declaration under Section 564(b)(1) of the Act, 21 U.S.C. section 360bbb-3(b)(1), unless the authorization is terminated or revoked.  Performed at [facility], [address] [place], [place] [address]     Blood Alcohol level:  Lab Results  Component Value Date   ETH 128 (H) 02/23/2021    Metabolic Disorder Labs: No results found for: HGBA1C, MPG No results found for: PROLACTIN No results found for: CHOL, TRIG, HDL, CHOLHDL, VLDL, LDLCALC  Physical Findings: AIMS: Facial and Oral Movements Muscles of Facial Expression: None, normal Lips and Perioral Area: None, normal Jaw: None, normal Tongue: None, normal,Extremity Movements Upper (arms, wrists, hands, fingers): None, normal Lower (legs, knees, ankles, toes): None, normal, Trunk Movements Neck, shoulders, hips: None, normal, Overall  Severity Severity of abnormal movements (highest score from questions above): None, normal Incapacitation due to abnormal movements: None, normal Patient's awareness of abnormal movements (rate only patient's report): No Awareness, Dental Status Current problems with teeth and/or dentures?: Yes (missing teeth) Does patient usually wear dentures?: No  CIWA:  CIWA-Ar Total: 0 COWS:     Musculoskeletal: Strength & Muscle Tone: within normal limits Gait & Station: normal Patient leans: N/A  Psychiatric Specialty Exam:  Presentation  General Appearance: No data recorded Eye Contact:No data recorded Speech:No data recorded Speech Volume:No data recorded Handedness:No data recorded  Mood and Affect  Mood:No data recorded Affect:No data recorded  Thought Process  Thought Processes:No data recorded Descriptions of Associations:No data recorded Orientation:No data recorded Thought  Content:No data recorded History of Schizophrenia/Schizoaffective disorder:No  Duration of Psychotic Symptoms:No data recorded Hallucinations:No data recorded Ideas of Reference:No data recorded Suicidal Thoughts:No data recorded Homicidal Thoughts:No data recorded  Sensorium  Memory:No data recorded Judgment:No data recorded Insight:No data recorded  Executive Functions  Concentration:No data recorded Attention Span:No data recorded Recall:No data recorded Fund of Knowledge:No data recorded Language:No data recorded  Psychomotor Activity  Psychomotor Activity:No data recorded  Assets  Assets:No data recorded  Sleep  Sleep:No data recorded   Physical Exam: Physical Exam Vitals and nursing note reviewed.  Constitutional:      Appearance: Normal appearance.  HENT:     Head: Normocephalic and atraumatic.     Mouth/Throat:     Pharynx: Oropharynx is clear.  Eyes:     Pupils: Pupils are equal, round, and reactive to light.  Cardiovascular:     Rate and Rhythm: Normal rate and regular rhythm.  Pulmonary:     Effort: Pulmonary effort is normal.     Breath sounds: Normal breath sounds.  Abdominal:     General: Abdomen is flat.     Palpations: Abdomen is soft.  Musculoskeletal:        General: Normal range of motion.  Skin:    General: Skin is warm and dry.  Neurological:     General: No focal deficit present.     Mental Status: He is alert. Mental status is at baseline.  Psychiatric:        Mood and Affect: Mood normal.        Thought Content: Thought content normal.    Review of Systems  Constitutional: Negative.   HENT: Negative.   Eyes: Negative.   Respiratory: Negative.   Cardiovascular: Negative.   Gastrointestinal: Negative.   Musculoskeletal: Negative.   Skin: Negative.   Neurological: Negative.   Psychiatric/Behavioral: Positive for substance abuse. Negative for depression and suicidal ideas.   Blood pressure 125/85, pulse 65, temperature 98.5  F (36.9 C), temperature source Oral, resp. rate 18, height 5\' 6"  (1.676 m), weight 78 kg, SpO2 97 %. Body mass index is 27.76 kg/m.   Treatment Plan Summary: Plan Patient appears to be doing much better.  Slept better at night.  Did not start antidepressants and at this point does not seem to be a definite diagnosis of a major depression that would require starting medicine.  No change to orders for today.  Encourage patient in eating well staying up out of bed and talk with the primary treatment team tomorrow.  , MD 02/25/2021, 12:44 PM

## 2021-02-25 NOTE — Progress Notes (Signed)
Patient is pleasant and cooperative. Mood continues to be sad. Affect flat. Denies SI, and HI. Voices no complaints

## 2021-02-26 NOTE — Progress Notes (Signed)
Patient is calm and cooperative with assessment. He denies SI, HI, and AVH. He denies depression and anxiety. He endorses leg pain 4/10 and was given Tylenol PRN. Patient denies having any other physical problems. Patient is observed to be interacting appropriately with staff and peers. Patient remains safe on the unit at this time and q15 min safety checks are maintained.

## 2021-02-26 NOTE — Progress Notes (Signed)
Recreation Therapy Notes   Date: 02/26/2021  Time: 9:30 am   Location: Craft room     Behavioral response: N/A   Intervention Topic: Time-Management   Discussion/Intervention: Patient did not attend group.   Clinical Observations/Feedback:  Patient did not attend group.   Adalia Pettis LRT/CTRS         Nicholl Onstott 02/26/2021 10:47 AM

## 2021-02-26 NOTE — Progress Notes (Signed)
Pt was educated on medications and follow up care. Pt questions were answered and pt verbalized understanding and did not voice any concerns. Pt's belongings were returned. Pt was safely discharged to [facility].

## 2021-02-26 NOTE — Discharge Summary (Signed)
Physician Discharge Summary Note  Patient:  Mark [name] is an 50 y.o., male MRN:  [id number] DOB:  05-24-1971 Patient phone:  [phone] (home)  Patient address:   [place] [address],  Total Time spent with patient: 35 minutes- 25 minutes face-to-face contact with patient, 10 minutes documentation, coordination of care, scripts   Date of Admission:  02/23/2021 Date of Discharge: 02/26/2021  Reason for Admission:  50 year old man presented to the emergency room reporting symptoms of depression and suicidal ideation.   Principal Problem: Severe major depression, single episode, without psychotic features [facility]) Discharge Diagnoses: Principal Problem:   Severe major depression, single episode, without psychotic features ([place]) Active Problems:   Cocaine abuse ([place])   Alcohol abuse   Tobacco abuse   COPD (chronic obstructive pulmonary disease) (HCC)   Leg pain   Past Psychiatric History:  Patient has a history of substance abuse, mood instability and mood problems especially in the context of drug use. Patient claims that he has had suicide attempts in the past details are limited.  Past Medical History:  Past Medical History:  Diagnosis Date  . Asthma   . Bleeding nose     Past Surgical History:  Procedure Laterality Date  . HERNIA REPAIR     Family History: History reviewed. No pertinent family history. Family Psychiatric  History: Denies Social History:  Social History   Substance and Sexual Activity  Alcohol Use Yes     Social History   Substance and Sexual Activity  Drug Use Never    Social History   Socioeconomic History  . Marital status: Single    Spouse name: Not on file  . Number of children: Not on file  . Years of education: Not on file  . Highest education level: Not on file  Occupational History  . Not on file  Tobacco Use  . Smoking status: Current Every Day Smoker    Types: Cigarettes  . Smokeless tobacco: Never Used   Substance and Sexual Activity  . Alcohol use: Yes  . Drug use: Never  . Sexual activity: Not Currently  Other Topics Concern  . Not on file  Social History Narrative  . Not on file   Social Determinants of Health   Financial Resource Strain: Not on file  Food Insecurity: Not on file  Transportation Needs: Not on file  Physical Activity: Not on file  Stress: Not on file  Social Connections: Not on file    Hospital Course:   Patient seen and chart reviewed.  50 year old man presented to the emergency room reporting symptoms of depression and suicidal ideation. Mood was sad, hopeless, and with low energy. This was in the context of crack cocaine use and drinking. During admission, these symptoms cleared as his sobriety time increased. With symptom improvement he no longer met criteria for MDD, nor did he warrant initiation of an antidepressant. He denies suicidal ideations, homicidal ideations, visual hallucinations, and auditory hallucinations. He plans to follow-up with [facility] services for outpatient substance abuse treatment.   Physical Findings: AIMS: Facial and Oral Movements Muscles of Facial Expression: None, normal Lips and Perioral Area: None, normal Jaw: None, normal Tongue: None, normal,Extremity Movements Upper (arms, wrists, hands, fingers): None, normal Lower (legs, knees, ankles, toes): None, normal, Trunk Movements Neck, shoulders, hips: None, normal, Overall Severity Severity of abnormal movements (highest score from questions above): None, normal Incapacitation due to abnormal movements: None, normal Patient's awareness of abnormal movements (rate only patient's report): No  Awareness, Dental Status Current problems with teeth and/or dentures?: Yes (missing teeth) Does patient usually wear dentures?: No  CIWA:  CIWA-Ar Total: 0 COWS:     Musculoskeletal: Strength & Muscle Tone: within normal limits Gait & Station: normal Patient leans: N/A   Psychiatric  Specialty Exam: General Appearance: Casual  Eye Contact::  Good  Speech:  Clear and Coherent and Normal Rate  Volume:  Normal  Mood:  Euthymic  Affect:  Congruent  Thought Process:  Coherent and Linear  Orientation:  Full (Time, Place, and Person)  Thought Content:  Logical  Suicidal Thoughts:  No  Homicidal Thoughts:  No  Memory:  Immediate;   Fair Recent;   Fair Remote;   Fair  Judgement:  Intact  Insight:  Fair  Psychomotor Activity:  Normal  Concentration:  Fair  Recall:  Fair  Fund of Knowledge:Fair  Language: Fair  Akathisia:  Negative  Handed:  Right  AIMS (if indicated):     Assets:  Communication Skills Desire for Improvement Housing Physical Health Resilience Social Support Talents/Skills Transportation Vocational/Educational  Sleep:  Number of Hours: 8  Cognition: WNL  ADL's:  Intact     Physical Exam: Physical Exam Vitals and nursing note reviewed.  Constitutional:      Appearance: Normal appearance.  HENT:     Head: Normocephalic and atraumatic.     Right Ear: External ear normal.     Left Ear: External ear normal.     Nose: Nose normal.     Mouth/Throat:     Mouth: Mucous membranes are moist.     Pharynx: Oropharynx is clear.  Eyes:     Conjunctiva/sclera: Conjunctivae normal.     Pupils: Pupils are equal, round, and reactive to light.     Comments: Strabismus  Cardiovascular:     Rate and Rhythm: Normal rate.     Pulses: Normal pulses.  Pulmonary:     Effort: Pulmonary effort is normal.     Breath sounds: Normal breath sounds.  Abdominal:     General: Abdomen is flat.     Palpations: Abdomen is soft.  Musculoskeletal:        General: No swelling. Normal range of motion.     Cervical back: Normal range of motion and neck supple.  Skin:    General: Skin is warm and dry.  Neurological:     General: No focal deficit present.     Mental Status: He is alert and oriented to person, place, and time.  Psychiatric:        Mood and  Affect: Mood normal.        Behavior: Behavior normal.        Thought Content: Thought content normal.        Judgment: Judgment normal.    Review of Systems  Constitutional: Negative.   HENT: Negative.   Eyes: Negative.   Respiratory: Negative.   Cardiovascular: Negative.   Gastrointestinal: Negative.   Endocrine: Negative.   Genitourinary: Negative.   Musculoskeletal: Positive for arthralgias. Negative for joint swelling.  Skin: Negative.   Allergic/Immunologic: Positive for environmental allergies and food allergies.  Neurological: Negative.   Hematological: Negative.   Psychiatric/Behavioral: Negative.    Blood pressure 127/85, pulse 78, temperature 97.9 F (36.6 C), temperature source Oral, resp. rate 18, height 5' 6" (1.676 m), weight 78 kg, SpO2 97 %. Body mass index is 27.76 kg/m.   Have you used any form of tobacco in the last 30 days? (Cigarettes, Smokeless Tobacco,   Cigars, and/or Pipes): Yes  Has this patient used any form of tobacco in the last 30 days? (Cigarettes, Smokeless Tobacco, Cigars, and/or Pipes)  Yes, A prescription for an FDA-approved tobacco cessation medication was offered at discharge and the patient refused  Blood Alcohol level:  Lab Results  Component Value Date   ETH 128 (H) 04/88/8916    Metabolic Disorder Labs:  No results found for: HGBA1C, MPG No results found for: PROLACTIN No results found for: CHOL, TRIG, HDL, CHOLHDL, VLDL, LDLCALC  See Psychiatric Specialty Exam and Suicide Risk Assessment completed by Attending Physician prior to discharge.  Discharge destination:  Home  Is patient on multiple antipsychotic therapies at discharge:  No   Has Patient had three or more failed trials of antipsychotic monotherapy by history:  No  Recommended Plan for Multiple Antipsychotic Therapies: NA  Discharge Instructions    Diet general   Complete by: As directed    Increase activity slowly   Complete by: As directed      Allergies as  of 02/26/2021      Reactions   Contrast Media [iodinated Diagnostic Agents] Anaphylaxis   Anaphylactic shock. Sneezing, itching, and throat swelling following IV contrast media bolus today, 12/30/17. Epi-pen given. Witness: Aggie Hacker, Vanetta Shawl, Dr. Alfred Levins.   Peanuts [peanut Oil]    Abdominal pain       Medication List    STOP taking these medications   amoxicillin 875 MG tablet Commonly known as: AMOXIL   cyclobenzaprine 10 MG tablet Commonly known as: FLEXERIL   magic mouthwash w/lidocaine Soln   predniSONE 50 MG tablet Commonly known as: DELTASONE     TAKE these medications     Indication  albuterol 108 (90 Base) MCG/ACT inhaler Commonly known as: VENTOLIN HFA Inhale 1-2 puffs into the lungs every 6 (six) hours as needed for wheezing or shortness of breath. What changed: Another medication with the same name was removed. Continue taking this medication, and follow the directions you see here.  Indication: Asthma   ibuprofen 600 MG tablet Commonly known as: ADVIL Take 1 tablet (600 mg total) by mouth every 8 (eight) hours as needed.  Indication: Pain   nicotine 14 mg/24hr patch Commonly known as: NICODERM CQ - dosed in mg/24 hours Place 1 patch (14 mg total) onto the skin daily.  Indication: Nicotine Addiction        Follow-up recommendations:  Activity:  as tolerated Diet:  regular diet  Comments:  Patient was not started on any medications during his hospital stay, and no prescriptions were refilled. He plans to follow-up with [facility] at discharge for outpatient substance abuse treatment.   Signed: Salley Scarlet, MD 02/26/2021, 9:28 AM

## 2021-02-26 NOTE — Progress Notes (Signed)
  [facility] Adult Case Management Discharge Plan :  Will you be returning to the same living situation after discharge:  Yes,  pt plans to return home. At discharge, do you have transportation home?: Yes,  pt states he has transportation. Do you have the ability to pay for your medications: No.  Release of information consent forms completed and in the chart;  Patient's signature needed at discharge.  Patient to Follow up at:  Follow-up Information    [facility] Go on 02/28/2021.   Why: Meet Lorella Nimrod at [facility] for peer support services on Wednesday, 02/28/21 at 7:30am. Thanks! Contact information: [address] [place] [address] [phone]               Next level of care provider has access to [facility] Link:no  Safety Planning and Suicide Prevention discussed: Yes,  SPE completed with Margo Common, niece's boyfriend.  Have you used any form of tobacco in the last 30 days? (Cigarettes, Smokeless Tobacco, Cigars, and/or Pipes): Yes  Has patient been referred to the Quitline?: Patient refused referral  Patient has been referred for addiction treatment: Pt. refused referral  Glenis Smoker, LCSW 02/26/2021, 10:00 AM

## 2021-02-26 NOTE — [facility] Suicide Risk Assessment (Signed)
[facility] Discharge Suicide Risk Assessment   Principal Problem: Severe major depression, single episode, without psychotic features [facility]) Discharge Diagnoses: Principal Problem:   Severe major depression, single episode, without psychotic features (HCC) Active Problems:   Cocaine abuse (HCC)   Alcohol abuse   Tobacco abuse   COPD (chronic obstructive pulmonary disease) (HCC)   Leg pain   Total Time spent with patient: 35 minutes- 25 minutes face-to-face contact with patient, 10 minutes documentation, coordination of care, scripts   Musculoskeletal: Strength & Muscle Tone: within normal limits Gait & Station: normal Patient leans: N/A  Psychiatric Specialty Exam: Review of Systems  Constitutional: Negative.   HENT: Negative.   Eyes: Negative.   Respiratory: Negative.   Cardiovascular: Negative.   Gastrointestinal: Negative.   Endocrine: Negative.   Genitourinary: Negative.   Musculoskeletal: Positive for arthralgias. Negative for joint swelling.  Skin: Negative.   Allergic/Immunologic: Positive for environmental allergies and food allergies.  Neurological: Negative.   Hematological: Negative.   Psychiatric/Behavioral: Negative.     Blood pressure 127/85, pulse 78, temperature 97.9 F (36.6 C), temperature source Oral, resp. rate 18, height 5\' 6"  (1.676 m), weight 78 kg, SpO2 97 %.Body mass index is 27.76 kg/m.  General Appearance: Casual  Eye Contact::  Good  Speech:  Clear and Coherent and Normal Rate  Volume:  Normal  Mood:  Euthymic  Affect:  Congruent  Thought Process:  Coherent and Linear  Orientation:  Full (Time, Place, and Person)  Thought Content:  Logical  Suicidal Thoughts:  No  Homicidal Thoughts:  No  Memory:  Immediate;   Fair Recent;   Fair Remote;   Fair  Judgement:  Intact  Insight:  Fair  Psychomotor Activity:  Normal  Concentration:  Fair  Recall:  [phone] of Knowledge:Fair  Language: Fair  Akathisia:  Negative  Handed:  Right  AIMS (if  indicated):     Assets:  Communication Skills Desire for Improvement Housing Physical Health Resilience Social Support Talents/Skills Transportation Vocational/Educational  Sleep:  Number of Hours: 8  Cognition: WNL  ADL's:  Intact   Mental Status Per Nursing Assessment::   On Admission:  NA  Demographic Factors:  Male  Loss Factors: NA  Historical Factors: Prior suicide attempts  Risk Reduction Factors:   Sense of responsibility to family, Employed, Living with another person, especially a relative, Positive social support, Positive therapeutic relationship and Positive coping skills or problem solving skills  Continued Clinical Symptoms:  Depression:   Recent sense of peace/wellbeing Alcohol/Substance Abuse/Dependencies Previous Psychiatric Diagnoses and Treatments  Cognitive Features That Contribute To Risk:  None    Suicide Risk:  Minimal: No identifiable suicidal ideation.  Patients presenting with no risk factors but with morbid ruminations; may be classified as minimal risk based on the severity of the depressive symptoms    Plan Of Care/Follow-up recommendations:  Activity:  as tolerated Diet:  regular diet  [phone], MD 02/26/2021, 9:24 AM

## 2021-02-26 NOTE — Tx Team (Signed)
Interdisciplinary Treatment and Diagnostic Plan Update  02/26/2021 Time of Session: 9:00AM Mark [name] MRN: [id number]  Principal Diagnosis: Severe major depression, single episode, without psychotic features (HCC)  Secondary Diagnoses: Principal Problem:   Severe major depression, single episode, without psychotic features (HCC) Active Problems:   Cocaine abuse (HCC)   Alcohol abuse   Tobacco abuse   COPD (chronic obstructive pulmonary disease) (HCC)   Leg pain   Current Medications:  Current Facility-Administered Medications  Medication Dose Route Frequency Provider Last Rate Last Admin  . acetaminophen (TYLENOL) tablet 650 mg  650 mg Oral Q6H PRN Clapacs, Jackquline Denmark, MD   650 mg at 02/26/21 0759  . albuterol (VENTOLIN HFA) 108 (90 Base) MCG/ACT inhaler 1-2 puff  1-2 puff Inhalation Q6H PRN Clapacs, John T, MD      . alum & mag hydroxide-simeth (MAALOX/MYLANTA) 200-200-20 MG/5ML suspension 30 mL  30 mL Oral Q4H PRN Clapacs, John T, MD      . hydrOXYzine (ATARAX/VISTARIL) tablet 50 mg  50 mg Oral TID PRN Clapacs, John T, MD      . ibuprofen (ADVIL) tablet 600 mg  600 mg Oral Q6H PRN Clapacs, John T, MD      . magnesium hydroxide (MILK OF MAGNESIA) suspension 30 mL  30 mL Oral Daily PRN Clapacs, John T, MD      . nicotine (NICODERM CQ - dosed in mg/24 hours) patch 21 mg  21 mg Transdermal Daily Clapacs, John T, MD   21 mg at 02/26/21 0800  . traZODone (DESYREL) tablet 100 mg  100 mg Oral QHS PRN Clapacs, Jackquline Denmark, MD       PTA Medications: Medications Prior to Admission  Medication Sig Dispense Refill Last Dose  . albuterol (PROVENTIL HFA;VENTOLIN HFA) 108 (90 Base) MCG/ACT inhaler Inhale 1-2 puffs into the lungs every 6 (six) hours as needed for wheezing or shortness of breath. 1 Inhaler 2   . albuterol (PROVENTIL HFA;VENTOLIN HFA) 108 (90 Base) MCG/ACT inhaler Inhale 2 puffs into the lungs every 6 (six) hours as needed for wheezing or shortness of breath. 1 Inhaler 2   . amoxicillin  (AMOXIL) 875 MG tablet Take 1 tablet (875 mg total) by mouth 2 (two) times daily. 14 tablet 0   . cyclobenzaprine (FLEXERIL) 10 MG tablet Take 1 tablet (10 mg total) by mouth 3 (three) times daily as needed. 15 tablet 0   . ibuprofen (ADVIL) 600 MG tablet Take 1 tablet (600 mg total) by mouth every 8 (eight) hours as needed. 15 tablet 0   . magic mouthwash w/lidocaine SOLN Take 5 mLs by mouth 4 (four) times daily. 240 mL 0   . nicotine (NICODERM CQ - DOSED IN MG/24 HOURS) 14 mg/24hr patch Place 1 patch (14 mg total) onto the skin daily. 28 patch 0   . predniSONE (DELTASONE) 50 MG tablet Take 1 tablet (50 mg total) by mouth daily with breakfast. 5 tablet 0     Patient Stressors: Financial difficulties Occupational concerns  Patient Strengths: Capable of independent living General fund of knowledge Motivation for treatment/growth  Treatment Modalities: Medication Management, Group therapy, Case management,  1 to 1 session with clinician, Psychoeducation, Recreational therapy.   Physician Treatment Plan for Primary Diagnosis: Severe major depression, single episode, without psychotic features (HCC) Long Term Goal(s): Improvement in symptoms so as ready for discharge Improvement in symptoms so as ready for discharge   Short Term Goals: Ability to disclose and discuss suicidal ideas Ability to demonstrate self-control will  improve Ability to identify and develop effective coping behaviors will improve Ability to maintain clinical measurements within normal limits will improve  Medication Management: Evaluate patient's response, side effects, and tolerance of medication regimen.  Therapeutic Interventions: 1 to 1 sessions, Unit Group sessions and Medication administration.  Evaluation of Outcomes: Adequate for Discharge  Physician Treatment Plan for Secondary Diagnosis: Principal Problem:   Severe major depression, single episode, without psychotic features (HCC) Active Problems:    Cocaine abuse (HCC)   Alcohol abuse   Tobacco abuse   COPD (chronic obstructive pulmonary disease) (HCC)   Leg pain  Long Term Goal(s): Improvement in symptoms so as ready for discharge Improvement in symptoms so as ready for discharge   Short Term Goals: Ability to disclose and discuss suicidal ideas Ability to demonstrate self-control will improve Ability to identify and develop effective coping behaviors will improve Ability to maintain clinical measurements within normal limits will improve     Medication Management: Evaluate patient's response, side effects, and tolerance of medication regimen.  Therapeutic Interventions: 1 to 1 sessions, Unit Group sessions and Medication administration.  Evaluation of Outcomes: Adequate for Discharge   RN Treatment Plan for Primary Diagnosis: Severe major depression, single episode, without psychotic features (HCC) Long Term Goal(s): Knowledge of disease and therapeutic regimen to maintain health will improve  Short Term Goals: Ability to remain free from injury will improve, Ability to verbalize frustration and anger appropriately will improve, Ability to demonstrate self-control, Ability to participate in decision making will improve, Ability to verbalize feelings will improve, Ability to disclose and discuss suicidal ideas, Ability to identify and develop effective coping behaviors will improve and Compliance with prescribed medications will improve  Medication Management: RN will administer medications as ordered by provider, will assess and evaluate patient's response and provide education to patient for prescribed medication. RN will report any adverse and/or side effects to prescribing provider.  Therapeutic Interventions: 1 on 1 counseling sessions, Psychoeducation, Medication administration, Evaluate responses to treatment, Monitor vital signs and CBGs as ordered, Perform/monitor CIWA, COWS, AIMS and Fall Risk screenings as ordered,  Perform wound care treatments as ordered.  Evaluation of Outcomes: Adequate for Discharge   LCSW Treatment Plan for Primary Diagnosis: Severe major depression, single episode, without psychotic features (HCC) Long Term Goal(s): Safe transition to appropriate next level of care at discharge, Engage patient in therapeutic group addressing interpersonal concerns.  Short Term Goals: Engage patient in aftercare planning with referrals and resources, Increase social support, Increase ability to appropriately verbalize feelings, Increase emotional regulation, Facilitate acceptance of mental health diagnosis and concerns, Identify triggers associated with mental health/substance abuse issues and Increase skills for wellness and recovery  Therapeutic Interventions: Assess for all discharge needs, 1 to 1 time with Social worker, Explore available resources and support systems, Assess for adequacy in community support network, Educate family and significant other(s) on suicide prevention, Complete Psychosocial Assessment, Interpersonal group therapy.  Evaluation of Outcomes: Adequate for Discharge   Progress in Treatment: Attending groups: Yes. Participating in groups: Yes. Taking medication as prescribed: Yes. Toleration medication: Yes. Family/Significant other contact made: Yes, individual(s) contacted:  Margo Common, niece's boyfriend. Patient understands diagnosis: Yes. Discussing patient identified problems/goals with staff: Yes. Medical problems stabilized or resolved: Yes. Denies suicidal/homicidal ideation: Yes. Issues/concerns per patient self-inventory: No. Other: None.  New problem(s) identified: No, Describe:  none.  New Short Term/Long Term Goal(s): medication management for mood stabilization; elimination of SI thoughts; development of comprehensive mental wellness/sobriety plan.  Patient Goals: "Do  what I got to do to get better and get back out there."   Discharge Plan or  Barriers: Pt expressed interest in outpatient treatment. CSW will assist with discharge/aftercare planning.  Reason for Continuation of Hospitalization: Depression Suicidal ideation  Estimated Length of Stay: 1-7 days  Attendees: Patient: Mark [name] 02/26/2021 9:39 AM  Physician: Les Pou, MD 02/26/2021 9:39 AM  Nursing: Adela Glimpse, RN 02/26/2021 9:39 AM  RN Care Manager: 02/26/2021 9:39 AM  Social Worker: Vilma Meckel. Algis Greenhouse, MSW, [place], LCAS 02/26/2021 9:39 AM  Recreational Therapist: Hilbert Bible, LRT  02/26/2021 9:39 AM  Other:  02/26/2021 9:39 AM  Other:  02/26/2021 9:39 AM  Other: 02/26/2021 9:39 AM    Scribe for Treatment Team: Glenis Smoker, LCSW 02/26/2021 9:39 AM

## 2021-05-19 IMAGING — CT CT NECK W/O CM
3 of 4 series · 13 of 33 positions shown, 16 images · non-contrast
Comparison: None.

CLINICAL DATA: Sore throat. Rule out infection. Difficulty
swallowing.

The patient has allergy to IV contrast with anaphylaxis.
EXAM:
CT NECK WITHOUT CONTRAST
TECHNIQUE: Multidetector CT imaging of the neck was performed following the
standard protocol without intravenous contrast.

[Series 5: sag neck · sagittal · 0.49mm/px · 5 of 111 slices shown, 6 images]
[im 37/111  bone]
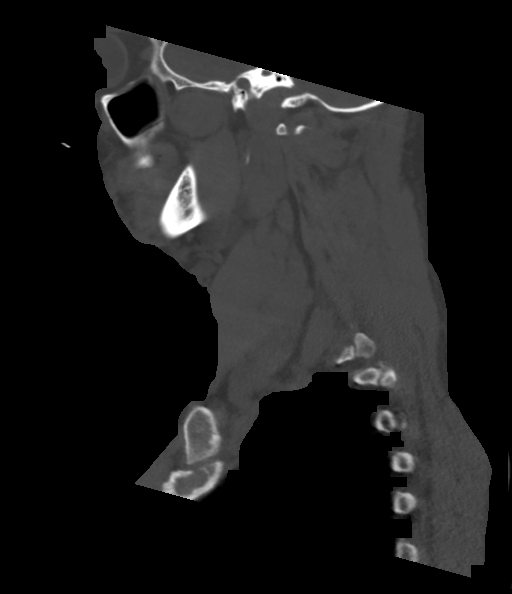
[im 46/111  bone]
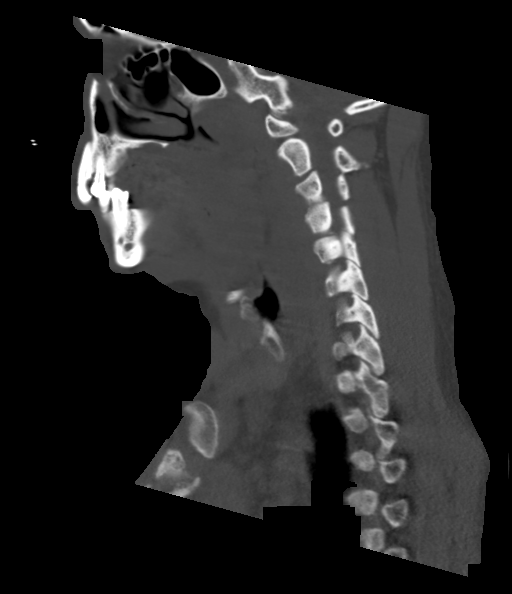
[im 56/111  soft-tissue]
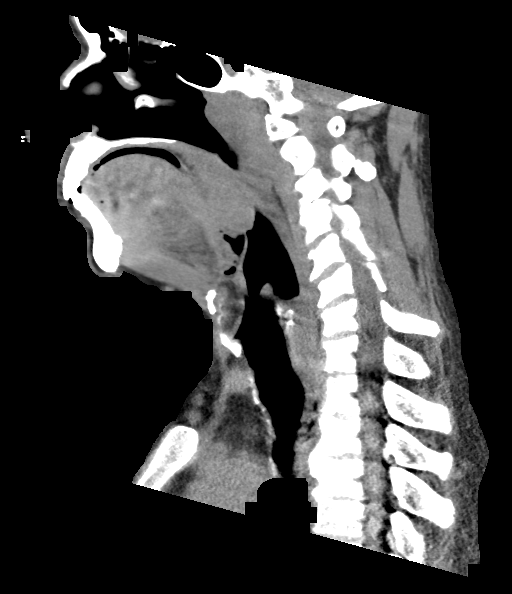
[im 56/111  bone]
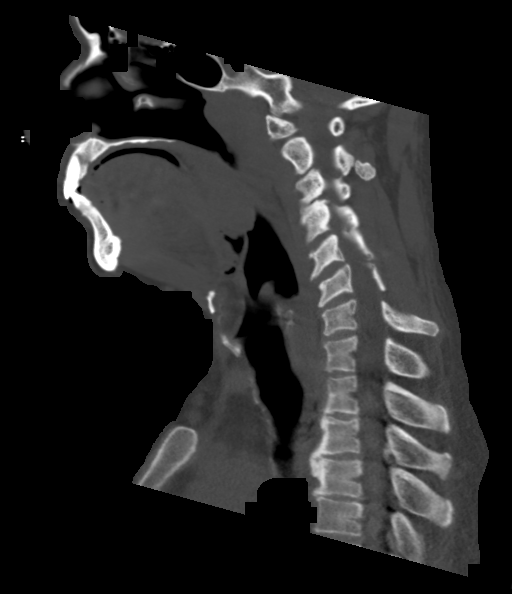
[im 65/111  bone]
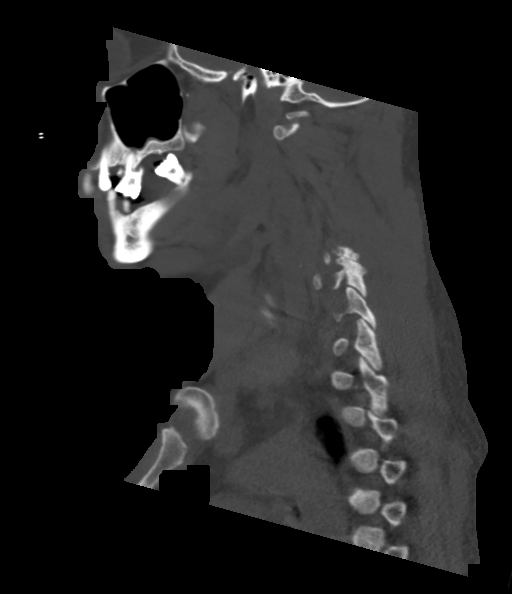
[im 74/111  bone]
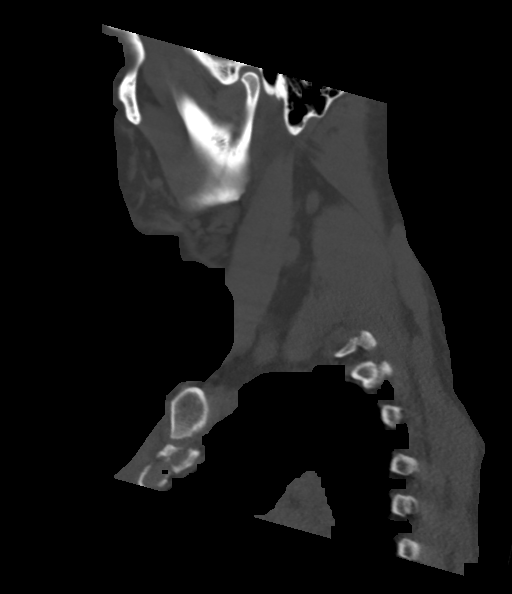

[Series 6: cor neck · coronal · 0.43mm/px · 3 of 126 slices shown]
[im 35/126  bone]
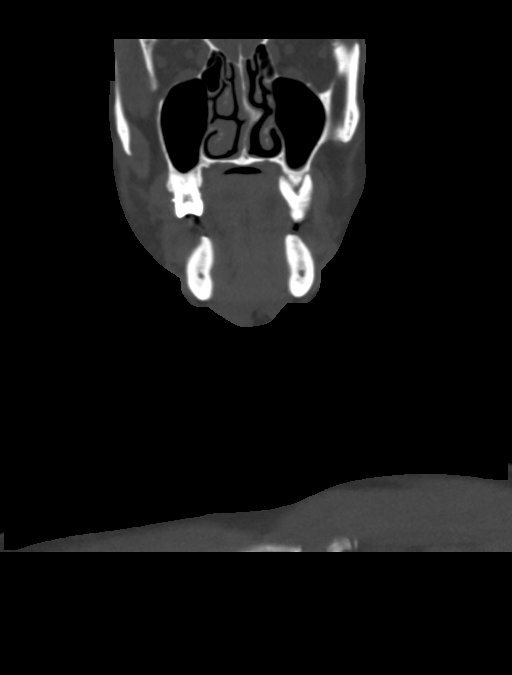
[im 54/126  bone]
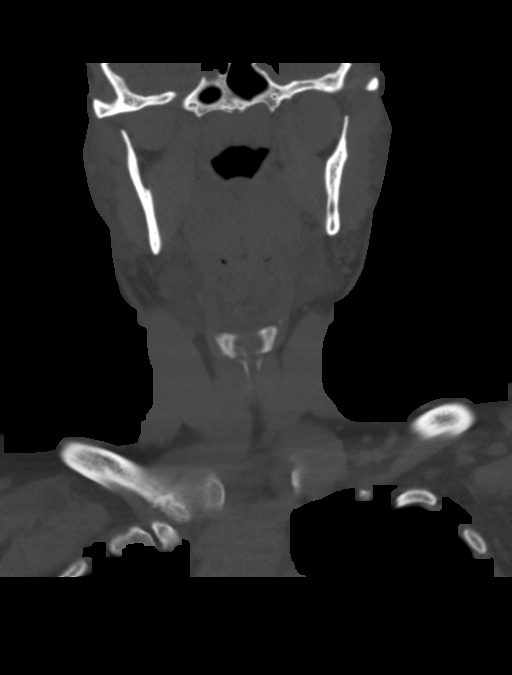
[im 72/126  bone]
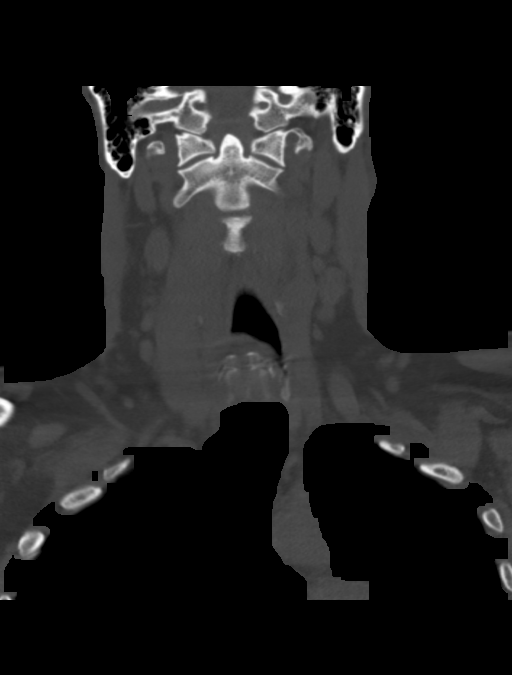

[Series 7: orthogonal ax · axial · 0.43mm/px · z∈[-287,-88]mm · 5 of 145 slices shown, 7 images]
[im 21/145  soft-tissue]
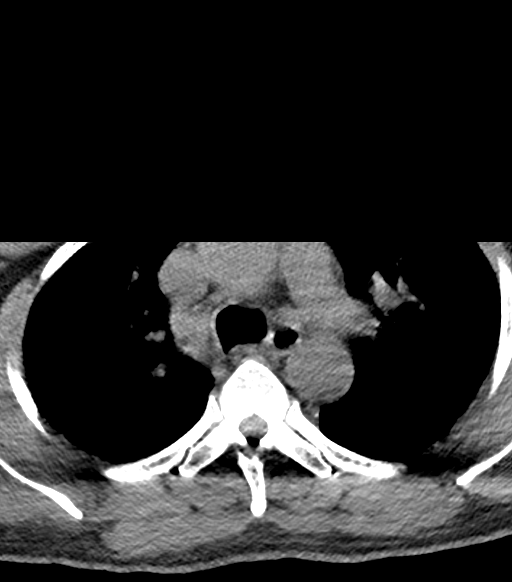
[im 21/145  bone]
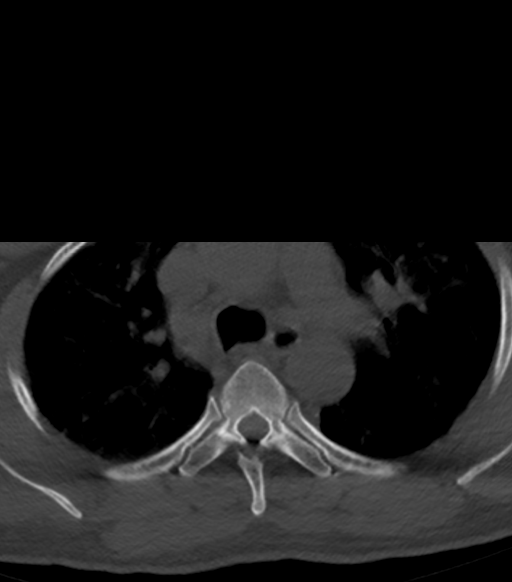
[im 42/145  bone]
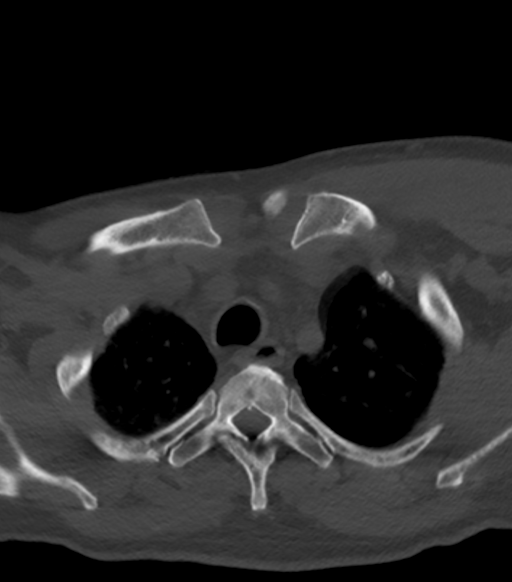
[im 83/145  bone]
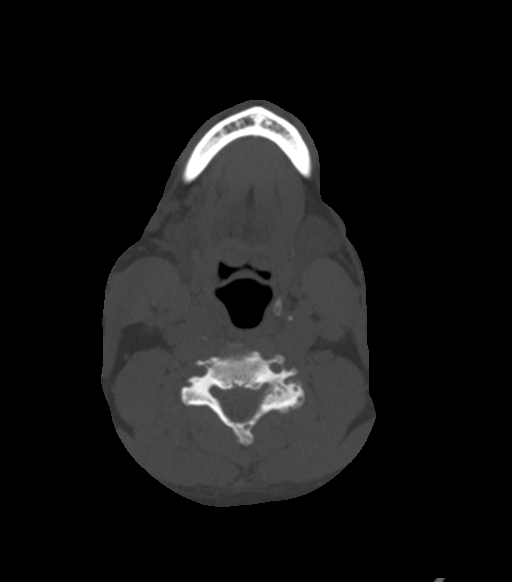
[im 103/145  bone]
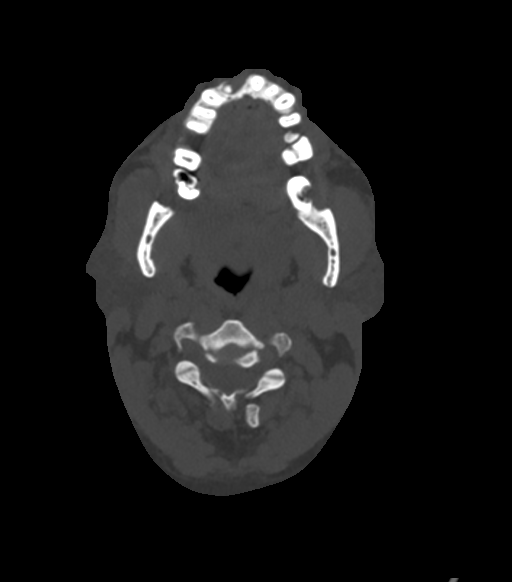
[im 124/145  soft-tissue]
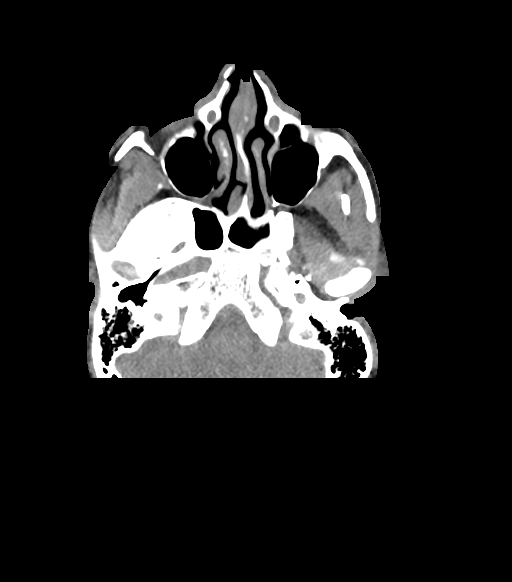
[im 124/145  bone]
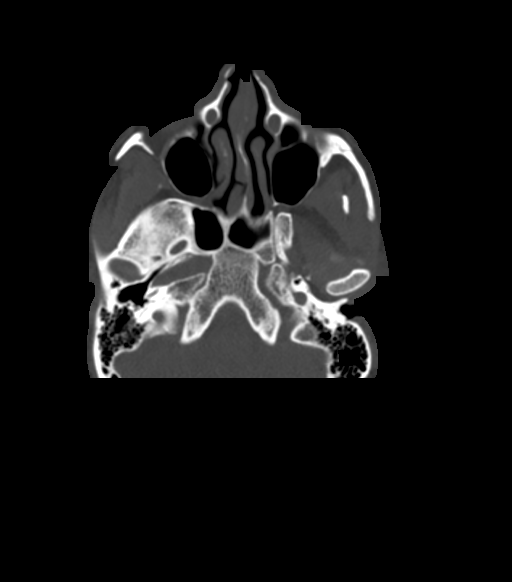

[13 of 33 positions shown; findings below may reference images not displayed]

FINDINGS: Pharynx and larynx: Significant enlargement of the tonsils
bilaterally. Lack of intravenous contrast limits evaluation for
abscess however no peritonsillar fluid collection is identified.
Epiglottis normal. Larynx normal

Salivary glands: No inflammation, mass, or stone.

Thyroid: Negative

Lymph nodes: Multiple cervical lymph nodes are present bilaterally
without pathologic enlargement. Right level 2 lymph node 10.7 mm.
Additional subcentimeter level 5 lymph nodes on the right. Left
level 2 lymph node 7 mm. Additional small subcentimeter posterior
lymph nodes on the left.

Vascular: Limited vascular evaluation without intravenous contrast.

Limited intracranial: Negative

Visualized orbits: Negative

Mastoids and visualized paranasal sinuses: Paranasal sinuses clear.
Mastoid clear.

Skeleton: Poor dentition with numerous caries and periapical dental
lucencies. Mild degenerative change in the cervical spine.

Upper chest: Lung apices clear bilaterally.

Other: None
IMPRESSION: 1. Significant enlargement of the tonsils bilaterally. Lack of
intravenous contrast limits ability to detect abscess however no
fluid collection is identified.
2. Reactive lymph nodes in the neck bilaterally.

## 2022-09-18 ENCOUNTER — Emergency Department
Admission: EM | Admit: 2022-09-18 | Discharge: 2022-09-19 | Disposition: A | Discharge status: Home / Self Care | Payer: Self-pay | Attending: Emergency Medicine | Admitting: Emergency Medicine

## 2022-09-18 ENCOUNTER — Other Ambulatory Visit: Payer: Self-pay

## 2022-09-18 ENCOUNTER — Emergency Department: Payer: Self-pay

## 2022-09-18 ENCOUNTER — Encounter: Payer: Self-pay | Admitting: Emergency Medicine

## 2022-09-18 DIAGNOSIS — F1490 Cocaine use, unspecified, uncomplicated: Secondary | ICD-10-CM | POA: Insufficient documentation

## 2022-09-18 DIAGNOSIS — J45909 Unspecified asthma, uncomplicated: Secondary | ICD-10-CM | POA: Insufficient documentation

## 2022-09-18 DIAGNOSIS — R0781 Pleurodynia: Secondary | ICD-10-CM

## 2022-09-18 DIAGNOSIS — R071 Chest pain on breathing: Secondary | ICD-10-CM | POA: Insufficient documentation

## 2022-09-18 LAB — CBC
HCT: 45.2 % (ref 39.0–52.0)
Hemoglobin: 15.8 g/dL (ref 13.0–17.0)
MCH: 32 pg (ref 26.0–34.0)
MCHC: 35 g/dL (ref 30.0–36.0)
MCV: 91.5 fL (ref 80.0–100.0)
Platelets: 248 10*3/uL (ref 150–400)
RBC: 4.94 MIL/uL (ref 4.22–5.81)
RDW: 12.6 % (ref 11.5–15.5)
WBC: 7.6 10*3/uL (ref 4.0–10.5)
nRBC: 0 % (ref 0.0–0.2)

## 2022-09-18 LAB — BASIC METABOLIC PANEL
Anion gap: 8 (ref 5–15)
BUN: 12 mg/dL (ref 6–20)
CO2: 23 mmol/L (ref 22–32)
Calcium: 9.4 mg/dL (ref 8.9–10.3)
Chloride: 105 mmol/L (ref 98–111)
Creatinine, Ser: 1.08 mg/dL (ref 0.61–1.24)
GFR, Estimated: >60 mL/min (ref >60)
Glucose, Bld: 127 mg/dL — ABNORMAL HIGH (ref 70–99)
Potassium: 3.6 mmol/L (ref 3.5–5.1)
Sodium: 136 mmol/L (ref 135–145)

## 2022-09-18 LAB — TROPONIN I (HIGH SENSITIVITY)
Troponin I (High Sensitivity): 3 ng/L (ref <18)
Troponin I (High Sensitivity): 3 ng/L (ref <18)

## 2022-09-18 LAB — D-DIMER, QUANTITATIVE: D-Dimer, Quant: <0.27 ug/mL-FEU (ref 0.00–0.50)

## 2022-09-18 NOTE — ED Triage Notes (Signed)
Patient to ED for detox and right sided chest pain. Patient states CP started last night after using cocaine. Patient describes pain has a sharp 8/10. Denies cardiac history.

## 2022-09-18 NOTE — ED Provider Notes (Signed)
[facility] Provider Note    Event Date/Time   First MD Initiated Contact with Patient 09/18/22 2207     (approximate)   History   Detox and Chest Pain   HPI  Mark [name] is a 51 y.o. male history of asthma has been using cocaine in the past alcohol.  He says he wants help getting off the cocaine.  He also complains of pleuritic chest pain in the right side of his chest.  He is not short of breath.  Its been going on since today.      Physical Exam   Triage Vital Signs: ED Triage Vitals  Enc Vitals Group     BP 09/18/22 1816 (!) 147/93     Pulse Rate 09/18/22 1816 94     Resp 09/18/22 1816 18     Temp 09/18/22 1816 98.6 F (37 C)     Temp Source 09/18/22 1816 Oral     SpO2 09/18/22 1816 94 %     Weight --      Height --      Head Circumference --      Peak Flow --      Pain Score 09/18/22 1828 8     Pain Loc --      Pain Edu? --      Excl. in [place]? --     Most recent vital signs: Vitals:   09/18/22 2200 09/18/22 2205  BP: 134/77   Pulse: 65   Resp: 17   Temp:  97.7 F (36.5 C)  SpO2: 94%     General: Awake, no distress.  CV:  Good peripheral perfusion.  Heart regular rate and rhythm no audible murmurs Resp:  Normal effort.  Lungs are clear Abd:  No distention.  Soft and nontender Extremities: No edema   ED Results / Procedures / Treatments   Labs (all labs ordered are listed, but only abnormal results are displayed) Labs Reviewed  BASIC METABOLIC PANEL - Abnormal; Notable for the following components:      Result Value   Glucose, Bld 127 (*)    All other components within normal limits  CBC  D-DIMER, QUANTITATIVE  TROPONIN I (HIGH SENSITIVITY)  TROPONIN I (HIGH SENSITIVITY)     EKG  EKG read and interpreted by me shows normal sinus rhythm rate of 75 left axis no acute changes   RADIOLOGY Chest x-ray read by radiology reviewed and interpreted by me as negative for acute changes   PROCEDURES:  Critical Care  performed:   Procedures   MEDICATIONS ORDERED IN ED: Medications - No data to display   IMPRESSION / MDM / [place] / ED COURSE  I reviewed the triage vital signs and the nursing notes. Patient's troponins are negative his EKG looks good his chest x-ray looks good his pain is pleuritic.  I will get a D-dimer and see if this shows anything. ----------------------------------------- 11:13 PM on 09/18/2022 ----------------------------------------- D-dimer is negative.  Patient is medically cleared we will see if we can get any help with his detox.     The patient is on the cardiac monitor to evaluate for evidence of arrhythmia and/or significant heart rate changes.  None have been seen You can try   FINAL CLINICAL IMPRESSION(S) / ED DIAGNOSES   Final diagnoses:  Pleuritic chest pain  Cocaine use     Rx / DC Orders   ED Discharge Orders     None  Note:  This document was prepared using Dragon voice recognition software and may include unintentional dictation errors.   Arnaldo Natal, MD 09/18/22 [phone]

## 2022-09-18 NOTE — Discharge Instructions (Addendum)
You can try Motrin 3 of the over-the-counter pills 3 times a day for 3 or 4 days.  That should help with the pain in your chest.  Please return for any shortness of breath or worsening pain or fever.

## 2022-09-19 NOTE — ED Notes (Signed)
Pt provided crackers and juice by Dr. Darnelle Catalan.

## 2022-09-19 NOTE — ED Notes (Signed)
E-signature pad unavailable - Pt verbalized understanding of D/C information - no additional concerns at this time.  

## 2022-09-19 NOTE — BH Assessment (Signed)
Pt provided with detox and IOP resources.

## 2022-09-19 NOTE — ED Provider Notes (Signed)
-----------------------------------------   1:07 AM on 09/19/2022 -----------------------------------------   TTS evaluated patient and gave him outpatient resources. Strict return precautions given. Patient verbalizes understanding and agrees with plan of care.   Irean Hong, MD 09/19/22 [phone]

## 2023-10-28 ENCOUNTER — Other Ambulatory Visit: Payer: Self-pay

## 2023-10-28 ENCOUNTER — Emergency Department: Payer: Self-pay

## 2023-10-28 ENCOUNTER — Emergency Department
Admission: EM | Admit: 2023-10-28 | Discharge: 2023-10-28 | Disposition: A | Discharge status: Home / Self Care | Payer: Self-pay | Attending: Emergency Medicine | Admitting: Emergency Medicine

## 2023-10-28 DIAGNOSIS — J020 Streptococcal pharyngitis: Secondary | ICD-10-CM | POA: Insufficient documentation

## 2023-10-28 DIAGNOSIS — J449 Chronic obstructive pulmonary disease, unspecified: Secondary | ICD-10-CM | POA: Insufficient documentation

## 2023-10-28 DIAGNOSIS — Z20822 Contact with and (suspected) exposure to covid-19: Secondary | ICD-10-CM | POA: Insufficient documentation

## 2023-10-28 LAB — RESP PANEL BY RT-PCR (RSV, FLU A&B, COVID)  RVPGX2
Influenza A by PCR: NEGATIVE
Influenza B by PCR: NEGATIVE
Resp Syncytial Virus by PCR: NEGATIVE
SARS Coronavirus 2 by RT PCR: NEGATIVE

## 2023-10-28 LAB — GROUP A STREP BY PCR: Group A Strep by PCR: DETECTED — AB

## 2023-10-28 MED ORDER — DEXAMETHASONE SODIUM PHOSPHATE 10 MG/ML IJ SOLN
10.0000 mg | Freq: Once | INTRAMUSCULAR | Status: AC
Start: 2023-10-28 — End: 2023-10-28
  Administered 2023-10-28: 10 mg via INTRAMUSCULAR
  Filled 2023-10-28: qty 1

## 2023-10-28 MED ORDER — AMOXICILLIN-POT CLAVULANATE 875-125 MG PO TABS: 1.0000 | ORAL_TABLET | Freq: Two times a day (BID) | ORAL | 0 refills | Status: AC

## 2023-10-28 MED ORDER — KETOROLAC TROMETHAMINE 15 MG/ML IJ SOLN
15.0000 mg | Freq: Once | INTRAMUSCULAR | Status: AC
  Administered 2023-10-28: 15 mg via INTRAMUSCULAR
  Filled 2023-10-28: qty 1

## 2023-10-28 NOTE — ED Triage Notes (Signed)
Pt comes with c/o sore throat, cough and congestion for about 4 days. Pt denies any fevers.

## 2023-10-28 NOTE — ED Provider Notes (Signed)
[facility] Provider Note    Event Date/Time   First MD Initiated Contact with Patient 10/28/23 1027     (approximate)   History   Chief Complaint: Sore Throat   HPI  Mark [name] is a 52 y.o. male with a history of depression, substance abuse, COPD who comes ED complaining of sore throat, nonproductive cough, congestion for the the past 4 days.  No fever or headaches, no neck pain.  Doing okay drinking fluids.          Physical Exam   Triage Vital Signs: ED Triage Vitals  Encounter Vitals Group     BP 10/28/23 0911 132/77     Systolic BP Percentile --      Diastolic BP Percentile --      Pulse Rate 10/28/23 0911 87     Resp 10/28/23 0911 18     Temp 10/28/23 0911 98.4 F (36.9 C)     Temp src --      SpO2 10/28/23 0911 97 %     Weight 10/28/23 0909 170 lb (77.1 kg)     Height 10/28/23 0909 5\' 9"  (1.753 m)     Head Circumference --      Peak Flow --      Pain Score 10/28/23 0909 6     Pain Loc --      Pain Education --      Exclude from Growth Chart --     Most recent vital signs: Vitals:   10/28/23 0911 10/28/23 1317  BP: 132/77 132/77  Pulse: 87 87  Resp: 18   Temp: 98.4 F (36.9 C) 98 F (36.7 C)  SpO2: 97% 97%    General: Awake, no distress.  CV:  Good peripheral perfusion.  Resp:  Normal effort.  Clear to auscultation bilaterally Abd:  No distention.  Other:  Limited ability to open the mouth for examination due to pain.  He does have tenderness submandibularly on the left.   ED Results / Procedures / Treatments   Labs (all labs ordered are listed, but only abnormal results are displayed) Labs Reviewed  GROUP A STREP BY PCR - Abnormal; Notable for the following components:      Result Value   Group A Strep by PCR DETECTED (*)    All other components within normal limits  RESP PANEL BY RT-PCR (RSV, FLU A&B, COVID)  [id number]     EKG    RADIOLOGY CT soft tissue neck interpreted by me, negative for abscess.   Airway patent.  Radiology report reviewed   PROCEDURES:  Procedures   MEDICATIONS ORDERED IN ED: Medications  ketorolac (TORADOL) 15 MG/ML injection 15 mg (15 mg Intramuscular Given 10/28/23 1045)  dexamethasone (DECADRON) injection 10 mg (10 mg Intramuscular Given 10/28/23 1045)     IMPRESSION / MDM / ASSESSMENT AND PLAN / ED COURSE  I reviewed the triage vital signs and the nursing notes.  DDx: PTA, RPA, strep pharyngitis, viral laryngitis  Patient's presentation is most consistent with acute presentation with potential threat to life or bodily function.  Patient presents with sore throat, limited mouth opening.  Respiratory swabs are positive for group A strep.  Will obtain CT to evaluate for PTA and airway patency.   Clinical Course as of 10/28/23 1429  Tue Oct 28, 2023  1252 CT reveals peritonsillar edema but no abscess. Tol. PO, breathing comfortably and resting in ED. VS normal. Stable for DC. Return precautions discussed. [PS]  Clinical Course User Index [PS] Sharman Cheek, MD     FINAL CLINICAL IMPRESSION(S) / ED DIAGNOSES   Final diagnoses:  Strep pharyngitis     Rx / DC Orders   ED Discharge Orders          Ordered    amoxicillin-clavulanate (AUGMENTIN) 875-125 MG tablet  2 times daily        10/28/23 1251             Note:  This document was prepared using Dragon voice recognition software and may include unintentional dictation errors.   Sharman Cheek, MD 10/28/23 [phone]

## 2024-04-03 ENCOUNTER — Other Ambulatory Visit: Payer: Self-pay

## 2024-04-03 ENCOUNTER — Emergency Department
Admission: EM | Admit: 2024-04-03 | Discharge: 2024-04-03 | Disposition: A | Discharge status: Home / Self Care | Payer: MEDICAID | Attending: Emergency Medicine | Admitting: Emergency Medicine

## 2024-04-03 ENCOUNTER — Emergency Department: Payer: MEDICAID

## 2024-04-03 DIAGNOSIS — M25511 Pain in right shoulder: Secondary | ICD-10-CM | POA: Insufficient documentation

## 2024-04-03 DIAGNOSIS — J4489 Other specified chronic obstructive pulmonary disease: Secondary | ICD-10-CM | POA: Insufficient documentation

## 2024-04-03 MED ORDER — KETOROLAC TROMETHAMINE 15 MG/ML IJ SOLN
15.0000 mg | Freq: Once | INTRAMUSCULAR | Status: AC
  Administered 2024-04-03: 15 mg via INTRAMUSCULAR
  Filled 2024-04-03: qty 1

## 2024-04-03 MED ORDER — ACETAMINOPHEN 325 MG PO TABS: 650.0000 mg | ORAL_TABLET | Freq: Once | ORAL | Status: DC

## 2024-04-03 MED ORDER — HYDROCODONE-ACETAMINOPHEN 5-325 MG PO TABS: 1.0000 | ORAL_TABLET | Freq: Four times a day (QID) | ORAL | 0 refills | Status: AC | PRN
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
## 2024-04-03 NOTE — Discharge Instructions (Addendum)
 The x-ray of your right shoulder did not show any fractures.  The x-ray of your left thumb shows a small avulsion fracture.  Please wear the wrist brace until evaluated by orthopedics.  You can take 650 mg of Tylenol  and 600 mg of ibuprofen  every 6 hours as needed for pain. You can use ice, heat, muscle creams and other topical pain relievers as well.  I have also sent a strong pain medication to the pharmacy for you.  This can be taken every 6 hours as needed for severe pain.  Do not drink alcohol or drive after taking this medication.  Please call orthopedics to schedule a follow-up appointment, their information is attached.

## 2024-08-01 ENCOUNTER — Other Ambulatory Visit: Payer: Self-pay

## 2024-08-01 ENCOUNTER — Emergency Department: Admission: EM | Admit: 2024-08-01 | Discharge: 2024-08-02 | Disposition: A | Discharge status: Home / Self Care

## 2024-08-01 DIAGNOSIS — F1910 Other psychoactive substance abuse, uncomplicated: Secondary | ICD-10-CM | POA: Diagnosis not present

## 2024-08-01 DIAGNOSIS — F1020 Alcohol dependence, uncomplicated: Secondary | ICD-10-CM | POA: Diagnosis not present

## 2024-08-01 DIAGNOSIS — R45850 Homicidal ideations: Secondary | ICD-10-CM | POA: Diagnosis not present

## 2024-08-01 DIAGNOSIS — F1990 Other psychoactive substance use, unspecified, uncomplicated: Secondary | ICD-10-CM | POA: Diagnosis not present

## 2024-08-01 DIAGNOSIS — F17200 Nicotine dependence, unspecified, uncomplicated: Secondary | ICD-10-CM | POA: Insufficient documentation

## 2024-08-01 DIAGNOSIS — F10920 Alcohol use, unspecified with intoxication, uncomplicated: Secondary | ICD-10-CM

## 2024-08-01 DIAGNOSIS — F10129 Alcohol abuse with intoxication, unspecified: Secondary | ICD-10-CM | POA: Diagnosis not present

## 2024-08-01 DIAGNOSIS — F1520 Other stimulant dependence, uncomplicated: Secondary | ICD-10-CM | POA: Insufficient documentation

## 2024-08-01 DIAGNOSIS — R45851 Suicidal ideations: Secondary | ICD-10-CM | POA: Insufficient documentation

## 2024-08-01 DIAGNOSIS — F29 Unspecified psychosis not due to a substance or known physiological condition: Secondary | ICD-10-CM | POA: Diagnosis not present

## 2024-08-01 DIAGNOSIS — F10220 Alcohol dependence with intoxication, uncomplicated: Secondary | ICD-10-CM | POA: Insufficient documentation

## 2024-08-01 DIAGNOSIS — Y907 Blood alcohol level of 200-239 mg/100 ml: Secondary | ICD-10-CM | POA: Diagnosis not present

## 2024-08-01 DIAGNOSIS — R456 Violent behavior: Secondary | ICD-10-CM | POA: Diagnosis not present

## 2024-08-01 DIAGNOSIS — F1924 Other psychoactive substance dependence with psychoactive substance-induced mood disorder: Secondary | ICD-10-CM

## 2024-08-01 DIAGNOSIS — Z79899 Other long term (current) drug therapy: Secondary | ICD-10-CM | POA: Insufficient documentation

## 2024-08-01 DIAGNOSIS — R9431 Abnormal electrocardiogram [ECG] [EKG]: Secondary | ICD-10-CM | POA: Diagnosis not present

## 2024-08-01 LAB — BASIC METABOLIC PANEL WITH GFR
Anion gap: 11 (ref 5–15)
BUN: 15 mg/dL (ref 6–20)
CO2: 25 mmol/L (ref 22–32)
Calcium: 9.3 mg/dL (ref 8.9–10.3)
Chloride: 101 mmol/L (ref 98–111)
Creatinine, Ser: 0.86 mg/dL (ref 0.61–1.24)
GFR, Estimated: >60 mL/min (ref >60)
Glucose, Bld: 98 mg/dL (ref 70–99)
Potassium: 4.2 mmol/L (ref 3.5–5.1)
Sodium: 137 mmol/L (ref 135–145)

## 2024-08-01 LAB — CBC WITH DIFFERENTIAL/PLATELET
Abs Immature Granulocytes: 0.03 K/uL (ref 0.00–0.07)
Basophils Absolute: 0.1 K/uL (ref 0.0–0.1)
Basophils Relative: 1 %
Eosinophils Absolute: 0 K/uL (ref 0.0–0.5)
Eosinophils Relative: 0 %
HCT: 43.9 % (ref 39.0–52.0)
Hemoglobin: 15.5 g/dL (ref 13.0–17.0)
Immature Granulocytes: 0 %
Lymphocytes Relative: 29 %
Lymphs Abs: 2.4 K/uL (ref 0.7–4.0)
MCH: 32.4 pg (ref 26.0–34.0)
MCHC: 35.3 g/dL (ref 30.0–36.0)
MCV: 91.8 fL (ref 80.0–100.0)
Monocytes Absolute: 0.5 K/uL (ref 0.1–1.0)
Monocytes Relative: 6 %
Neutro Abs: 5.2 K/uL (ref 1.7–7.7)
Neutrophils Relative %: 64 %
Platelets: 228 K/uL (ref 150–400)
RBC: 4.78 MIL/uL (ref 4.22–5.81)
RDW: 12.6 % (ref 11.5–15.5)
WBC: 8.3 K/uL (ref 4.0–10.5)
nRBC: 0 % (ref 0.0–0.2)

## 2024-08-01 LAB — ETHANOL: Alcohol, Ethyl (B): 209 mg/dL — ABNORMAL HIGH (ref <15)

## 2024-08-01 NOTE — ED Notes (Signed)
Pt up to restroom and back to his room.

## 2024-08-01 NOTE — ED Notes (Signed)
 Pt given water per request

## 2024-08-01 NOTE — ED Notes (Signed)
 Pt on Tele psych call .
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
## 2024-08-01 NOTE — ED Notes (Signed)
 Pt given snack and drink at this time. Pt calm and cooperative.

## 2024-08-01 NOTE — ED Notes (Signed)
 Pt given dinner tray.

## 2024-08-01 NOTE — [facility] Counselor (Signed)
 [facility] Tele psych consult has been requested.

## 2024-08-01 NOTE — Consult Note (Signed)
 [facility] Consult Note  Patient Name: Mark [name] MRN: [id number] DOB: 1970-12-12 DATE OF Consult: 08/01/2024  PRIMARY PSYCHIATRIC DIAGNOSES  1.  Substance induced mood disorder 2.  Stimulant use disorder, severe 3.  Alcohol use disorder, severe   RECOMMENDATIONS  Recommendations: Medication recommendations: None at this time  Non-Medication/therapeutic recommendations: Resources for outpatient therapy, psychiatry, substance use treatment; crisis line information; ED return precautions Is inpatient psychiatric hospitalization recommended for this patient? No (Explain why): Patient made suicidal statements and statements about wanting to harm someone else when he was intoxicated on crack cocaine and alcohol. Denies now that he is sober.  From a psychiatric perspective, is this patient appropriate for discharge to an outpatient setting/resource or other less restrictive environment for continued care?  Yes (Explain why): See above Follow-Up Telepsychiatry C/L services: We will sign off for now. Please re-consult our service if needed for any concerning changes in the patient's condition, discharge planning, or questions. Communication: Treatment team members (and family members if applicable) who were involved in treatment/care discussions and planning, and with whom we spoke or engaged with via secure text/chat, include the following: Team via Epic chat  Patient is at chronically elevated risk for harm to self and others due to history of depression, substance use, male gender. His acute risk is not currently elevated above his baseline risk and he denies suicidal and homicidal ideation, is future oriented and identifies his nieces and nephews as protective. Patient reports he was intoxicated on crack and alcohol when he made statements about wanting to hurt his girlfriend, denies that he had intent or plan, denies current thoughts to harm or kill her. States he made statements about  wanting to harm himself due to being angry at her, denies that he had intent or plan and denies suicidal thoughts currently.  Thank you for involving us  in the care of this patient. If you have any additional questions or concerns, please call [phone] and ask for me or the provider on-call.  TELEPSYCHIATRY ATTESTATION & CONSENT  As the provider for this telehealth consult, I attest that I verified the patient's identity using two separate identifiers, introduced myself to the patient, provided my credentials, disclosed my location, and performed this encounter via a HIPAA-compliant, real-time, face-to-face, two-way, interactive audio and video platform and with the full consent and agreement of the patient (or guardian as applicable.)  Patient physical location: ED in [facility]  Telehealth provider physical location: home office in state of [place]   Video start time: 2146 EST Video end time: 2201 EST   IDENTIFYING DATA  Mark [name] is a 53 y.o. year-old male for whom a psychiatric consultation has been ordered by the primary provider. The patient was identified using two separate identifiers.  CHIEF COMPLAINT/REASON FOR CONSULT  Homicidal and suicidal ideation    HISTORY OF PRESENT ILLNESS (HPI)  Mark [name] is a 53 year old male with a history of MDD with psychotic features, stimulant and alcohol use disorders who presents to the ED after reportedly threatening to kill someone and in the ED endorsing suicidal and homicidal ideation with plans. Reportedly reiterated homicidal ideation when EMS arrived. Chart reviewed, ethanol 209 on arrival, no UDS resulted. Psychiatry consulted for evaluation and management.   On evaluation, patient noted to be calm, cooperative, linear, not appearing internally preoccupied, not responding to internal stimuli, alert and oriented x 3. Patient states me and my lady are having problems. Patient states he is trying to disrespect me around her  friends. I know she's pregnant. She's trying to get me to take care of it. I ain't going to hurt her because I love her. Patient states earlier today after drinking and smoking crack, he made statements about wanting to hurt her, denies that he has homicidal ideation, intent, plan. Denies that he had intent or plan to harm her. Denies current thoughts to harm her. 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 Patient states he made suicidal statements earlier because he was tired and mad. He denies that he had suicidal intent. He denies current suicidal ideation, intent, plan. Patient denies access to firearms. Patient reports his nieces and nephews are protective against suicide as his his job. Patient reports he last used crack/cocaine this morning. Patient reports he was drinking alcohol today, does not know how much he was drinking. He states he thinks he made those statements due to bad drugs. Reports he uses crack 3-4 times per week, states he drinks daily. Patient denies depressed mood, other depressive symptoms. Denies symptoms consistent with mania/hypomania, paranoia, auditory and visual hallucinations. Patient denies that he currently takes medication for his mental health. He denies that he has a psychiatrist or therapist. He states he didn't like the attitude of one of the workers at the place where he was getting therapy so stopped a few years ago. Patient reports he works at [employer] and has been there for 6 years. States he wants to go to work tomorrow. Reports he lives alone.   Per patient's nephew [phone]), states patient called him today and was crying. States patient was making suicidal statements and saying he wanted to hurt a woman, doesn't think patient said he wants to kill her, denies that patient identifies a plan or intent to him.SABRA He doesn't know who the person is. He states he thinks the person is a drug addict prostitute. He reports that he thinks patient  was using some drugs that made him this way. States he had never heard patient speak this way previously.    PAST PSYCHIATRIC HISTORY  Prior psychiatric hospitalizations: Yes, April 2022 Outpatient mental health: Denies Violence history: Denies  Suicide attempts: Denies    C-SSRS 1) In the past month have you wished you were dead or wished you could go to sleep and not wake up? []  Yes [x]   No 2) In the past month have you actually had any thoughts of killing yourself? [x]  Yes  []   No If YES to 2, ask questions 3, 4, 5, and 6. If NO to 2, go directly to question 6 3) In the past month have you been thinking about how you might do this? []   Yes  [x]   No 4) In the past month have you had these thoughts and had some intention of acting on them?  []   Yes [x]   No 5) In the past month have you started to work out or worked out the details of how to kill yourself? Do you intend to carry out this plan? []   Yes [x]   No 6) Have you ever done anything, started to do anything, or prepared to do anything to end your life? []   Yes [x]   No  Otherwise as per HPI above.  PAST MEDICAL HISTORY  Past Medical History:  Diagnosis Date   Asthma    Bleeding nose      HOME MEDICATIONS  PTA Medications  Medication Sig   albuterol  (PROVENTIL  HFA;VENTOLIN  HFA) 108 (90 Base) MCG/ACT inhaler  Inhale 1-2 puffs into the lungs every 6 (six) hours as needed for wheezing or shortness of breath. (Patient not taking: Reported on 08/01/2024)   nicotine  (NICODERM CQ  - DOSED IN MG/24 HOURS) 14 mg/24hr patch Place 1 patch (14 mg total) onto the skin daily. (Patient not taking: Reported on 08/01/2024)   ibuprofen  (ADVIL ) 600 MG tablet Take 1 tablet (600 mg total) by mouth every 8 (eight) hours as needed. (Patient not taking: Reported on 08/01/2024)     ALLERGIES  Allergies  Allergen Reactions   Contrast Media [Iodinated Contrast Media] Anaphylaxis    Anaphylactic shock.  Sneezing, itching, and throat swelling following  IV contrast media bolus today, 12/30/17. Epi-pen given. Witness: Elouise Lesches, Manuelita Homans, Dr. Edelmiro.   Peanut-Containing Drug Products Other (See Comments)    Upset stomach   Peanuts [Peanut Oil]     Abdominal pain      SOCIAL & SUBSTANCE USE HISTORY  Social History   Socioeconomic History   Marital status: Single    Spouse name: Not on file   Number of children: Not on file   Years of education: Not on file   Highest education level: Not on file  Occupational History   Not on file  Tobacco Use   Smoking status: Every Day    Types: Cigarettes   Smokeless tobacco: Never  Substance and Sexual Activity   Alcohol use: Yes    Comment: weekly   Drug use: Never   Sexual activity: Not Currently  Other Topics Concern   Not on file  Social History Narrative   Not on file   Social Drivers of Health   Financial Resource Strain: Not on file  Food Insecurity: Not on file  Transportation Needs: Not on file  Physical Activity: Not on file  Stress: Not on file  Social Connections: Not on file   Social History   Tobacco Use  Smoking Status Every Day   Types: Cigarettes  Smokeless Tobacco Never   Social History   Substance and Sexual Activity  Alcohol Use Yes   Comment: weekly   Social History   Substance and Sexual Activity  Drug Use Never    Additional pertinent information Crack 3-4 times per week, alcohol daily - does not know amount .  FAMILY HISTORY  History reviewed. No pertinent family history.   MENTAL STATUS EXAM (MSE)  Mental Status Exam: General Appearance: Casual  Orientation:  Full (Time, Place, and Person)  Memory:  Immediate;   Good Recent;   Good  Concentration:  Concentration: Good  Recall:  Good  Attention  Good  Eye Contact:  Good  Speech:  Clear and Coherent  Language:  Good  Volume:  Normal  Mood: I feel good  Affect:  Appropriate  Thought Process:  Coherent  Thought Content:  Abstract Reasoning and Computation  Suicidal  Thoughts:  No  Homicidal Thoughts:  No  Judgement:  Fair  Insight:  Fair  Psychomotor Activity:  Normal  Akathisia:  NA  Fund of Knowledge:  Good    Assets:  Communication Skills Housing Social Support Vocational/Educational  Cognition:  WNL  ADL's:  Intact  AIMS (if indicated):       VITALS  Blood pressure 104/64, pulse 88, temperature 98.3 F (36.8 C), temperature source Oral, resp. rate 18, height 5' 8 (1.727 m), weight 63.5 kg, SpO2 98%.  LABS  Admission on 08/01/2024  Component Date Value Ref Range Status   WBC 08/01/2024 8.3  4.0 - 10.5 K/uL  Final   RBC 08/01/2024 4.78  4.22 - 5.81 MIL/uL Final   Hemoglobin 08/01/2024 15.5  13.0 - 17.0 g/dL Final   HCT 90/78/7974 43.9  39.0 - 52.0 % Final   MCV 08/01/2024 91.8  80.0 - 100.0 fL Final   MCH 08/01/2024 32.4  26.0 - 34.0 pg Final   MCHC 08/01/2024 35.3  30.0 - 36.0 g/dL Final   RDW 90/78/7974 12.6  11.5 - 15.5 % Final   Platelets 08/01/2024 228  150 - 400 K/uL Final   nRBC 08/01/2024 0.0  0.0 - 0.2 % Final   Neutrophils Relative % 08/01/2024 64  % Final   Neutro Abs 08/01/2024 5.2  1.7 - 7.7 K/uL Final   Lymphocytes Relative 08/01/2024 29  % Final   Lymphs Abs 08/01/2024 2.4  0.7 - 4.0 K/uL Final   Monocytes Relative 08/01/2024 6  % Final   Monocytes Absolute 08/01/2024 0.5  0.1 - 1.0 K/uL Final   Eosinophils Relative 08/01/2024 0  % Final   Eosinophils Absolute 08/01/2024 0.0  0.0 - 0.5 K/uL Final   Basophils Relative 08/01/2024 1  % Final   Basophils Absolute 08/01/2024 0.1  0.0 - 0.1 K/uL Final   Immature Granulocytes 08/01/2024 0  % Final   Abs Immature Granulocytes 08/01/2024 0.03  0.00 - 0.07 K/uL Final   Performed at [facility], [address] [place], [place] [address]   Sodium 08/01/2024 137  135 - 145 mmol/L Final   Potassium 08/01/2024 4.2  3.5 - 5.1 mmol/L Final   Chloride 08/01/2024 101  98 - 111 mmol/L Final   CO2 08/01/2024 25  22 - 32 mmol/L Final   Glucose, Bld 08/01/2024 98  70 - 99  mg/dL Final   Glucose reference range applies only to samples taken after fasting for at least 8 hours.   BUN 08/01/2024 15  6 - 20 mg/dL Final   Creatinine, Ser 08/01/2024 0.86  0.61 - 1.24 mg/dL Final   Calcium 90/78/7974 9.3  8.9 - 10.3 mg/dL Final   GFR, Estimated 08/01/2024 >60  >60 mL/min Final   Comment: (NOTE) Calculated using the CKD-EPI Creatinine Equation (2021)    Anion gap 08/01/2024 11  5 - 15 Final   Performed at [facility], [address] [place], [place] [address]   Alcohol, Ethyl (B) 08/01/2024 209 (H)  <15 mg/dL Final   Comment: (NOTE) For medical purposes only. Performed at [facility], [address] [place], [place] [address]     PSYCHIATRIC REVIEW OF SYSTEMS (ROS)  ROS: Notable for the following relevant positive findings: Review of Systems  Psychiatric/Behavioral: Negative.      Additional findings:      Musculoskeletal: No abnormal movements observed      Gait & Station: Laying/Sitting      Pain Screening: Denies      Nutrition & Dental Concerns: n/a  RISK FORMULATION/ASSESSMENT  Is the patient experiencing any suicidal or homicidal ideations: No        Protective factors considered for safety management: Nieces and nephews, future oriented, identifies reasons to live  Risk factors/concerns considered for safety management:  Depression Substance abuse/dependence Male gender  Is there a safety management plan with the patient and treatment team to minimize risk factors and promote protective factors: Yes           Explain: Resources for outpatient therapy, psychiatry, substance use, crisis line information, ED return precautions   Is crisis care placement or psychiatric  hospitalization recommended: No     Based on my current evaluation and risk assessment, patient is determined at this time to be at: Low Patient is at chronically elevated risk for harm to self and others due to history of depression, substance use, male  gender. His acute risk is not currently elevated above his baseline risk and he denies suicidal and homicidal ideation, is future oriented and identifies his nieces and nephews as protective.   *RISK ASSESSMENT Risk assessment is a dynamic process; it is possible that this patient's condition, and risk level, may change. This should be re-evaluated and managed over time as appropriate. Please re-consult psychiatric consult services if additional assistance is needed in terms of risk assessment and management. If your team decides to discharge this patient, please advise the patient how to best access emergency psychiatric services, or to call 911, if their condition worsens or they feel unsafe in any way.   Erla JAYSON Rase, MD Telepsychiatry Consult Services
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
## 2024-08-01 NOTE — ED Notes (Signed)
 Pt telling this RN are you going to feed me? Im hungry. If you don't ill call [employer] and have my business bring me something to eat. Pt informed need blood work and the Dr to see him before I can give him something to eat.
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
## 2024-08-01 NOTE — ED Notes (Signed)
 Pt currently sleeping, even unlabored respirations. Will continue to monitor.

## 2024-08-01 NOTE — ED Notes (Signed)
 Pt given sandwich tray. Dietary called for dinner tray

## 2024-08-02 NOTE — ED Notes (Signed)
 Vol /papers rescinded

## 2024-08-02 NOTE — ED Notes (Signed)
 Per previous RN, patient is up for discharge

## 2024-08-02 NOTE — ED Notes (Signed)
 Pt awake and watching tv in room at this time.

## 2024-08-02 NOTE — ED Notes (Signed)
 ivc rescinded by MD Ward.

## 2024-08-02 NOTE — ED Notes (Signed)
Pt was provided breakfast tray

## 2024-08-02 NOTE — ED Notes (Signed)
 We are out of bus passes in the department. Will have to wait for day shift to have them restalked.

## 2024-08-02 NOTE — ED Provider Notes (Signed)
 3:30 AM  Assumed care at shift change.  Patient here for SI, HI after using alcohol and crack cocaine.  No longer symptomatic.  Likely substance-induced.  Contracts for safety.  Seen and cleared by psychiatry.  Medical workup also unremarkable other than alcohol of 209 at 3:40 PM yesterday.  Now clinically sober.  I will rescind his IVC.  Will discharge with outpatient resources.  He states he will have to take a bus in the morning.   Stesha Neyens, Josette SAILOR, DO 08/02/24 [phone]

## 2024-08-19 ENCOUNTER — Emergency Department
Admission: EM | Admit: 2024-08-19 | Discharge: 2024-08-19 | Disposition: A | Discharge status: Home / Self Care | Attending: Emergency Medicine | Admitting: Emergency Medicine

## 2024-08-19 ENCOUNTER — Encounter: Payer: Self-pay | Admitting: Emergency Medicine

## 2024-08-19 ENCOUNTER — Other Ambulatory Visit: Payer: Self-pay

## 2024-08-19 ENCOUNTER — Emergency Department

## 2024-08-19 DIAGNOSIS — Z23 Encounter for immunization: Secondary | ICD-10-CM | POA: Insufficient documentation

## 2024-08-19 DIAGNOSIS — F17200 Nicotine dependence, unspecified, uncomplicated: Secondary | ICD-10-CM | POA: Insufficient documentation

## 2024-08-19 DIAGNOSIS — J449 Chronic obstructive pulmonary disease, unspecified: Secondary | ICD-10-CM | POA: Insufficient documentation

## 2024-08-19 DIAGNOSIS — S0990XA Unspecified injury of head, initial encounter: Secondary | ICD-10-CM

## 2024-08-19 DIAGNOSIS — J439 Emphysema, unspecified: Secondary | ICD-10-CM | POA: Diagnosis not present

## 2024-08-19 DIAGNOSIS — W228XXA Striking against or struck by other objects, initial encounter: Secondary | ICD-10-CM | POA: Diagnosis not present

## 2024-08-19 DIAGNOSIS — S0003XA Contusion of scalp, initial encounter: Secondary | ICD-10-CM | POA: Insufficient documentation

## 2024-08-19 DIAGNOSIS — M47812 Spondylosis without myelopathy or radiculopathy, cervical region: Secondary | ICD-10-CM | POA: Diagnosis not present

## 2024-08-19 DIAGNOSIS — S199XXA Unspecified injury of neck, initial encounter: Secondary | ICD-10-CM | POA: Diagnosis not present

## 2024-08-19 DIAGNOSIS — M5030 Other cervical disc degeneration, unspecified cervical region: Secondary | ICD-10-CM | POA: Diagnosis not present

## 2024-08-19 MED ORDER — TETANUS-DIPHTH-ACELL PERTUSSIS 5-2-15.5 LF-MCG/0.5 IM SUSP
0.5000 mL | Freq: Once | INTRAMUSCULAR | Status: AC
  Administered 2024-08-19: 0.5 mL via INTRAMUSCULAR
  Filled 2024-08-19: qty 0.5

## 2024-08-19 NOTE — Discharge Instructions (Signed)
 Your CT scans were normal.  You were given an updated tetanus shot.  Please follow-up with your outpatient provider.  Please return for any new, worsening, or changing symptoms or other concerns.
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
## 2024-08-19 NOTE — ED Notes (Signed)
 Patient's wound on head cleaned at this time.
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
